# Patient Record
Sex: Male | Born: 1965
Health system: Southern US, Community
[De-identification: ages and names within clinical notes are randomized; demographics above are authoritative.]

## PROBLEM LIST (undated history)

## (undated) DIAGNOSIS — H35379 Puckering of macula, unspecified eye: Secondary | ICD-10-CM

## (undated) DIAGNOSIS — E785 Hyperlipidemia, unspecified: Secondary | ICD-10-CM

## (undated) HISTORY — DX: Puckering of macula, unspecified eye: H35.379

## (undated) HISTORY — PX: CIRCUMCISION: SUR203

## (undated) HISTORY — DX: Hyperlipidemia, unspecified: E78.5

---

## 2003-09-20 ENCOUNTER — Encounter: Payer: Self-pay | Admitting: Family Medicine

## 2006-02-04 ENCOUNTER — Ambulatory Visit: Payer: Self-pay | Admitting: Family Medicine

## 2006-02-04 LAB — CONVERTED CEMR LAB
Albumin: 4 g/dL (ref 3.5–5.2)
Basophils Absolute: 0 10*3/uL (ref 0.0–0.1)
Bilirubin, Direct: 0.1 mg/dL (ref 0.0–0.3)
Cholesterol: 188 mg/dL (ref 0–200)
Creatinine, Ser: 1 mg/dL (ref 0.4–1.5)
Eosinophils Absolute: 0.1 10*3/uL (ref 0.0–0.6)
HCT: 42.4 % (ref 39.0–52.0)
Hemoglobin: 14.9 g/dL (ref 13.0–17.0)
MCHC: 35.2 g/dL (ref 30.0–36.0)
MCV: 95.9 fL (ref 78.0–100.0)
Monocytes Absolute: 0.3 10*3/uL (ref 0.2–0.7)
Neutrophils Relative %: 67.5 % (ref 43.0–77.0)
PSA: 0.77 ng/mL (ref 0.10–4.00)
Potassium: 4.5 meq/L (ref 3.5–5.1)
Sodium: 142 meq/L (ref 135–145)
TSH: 1.11 microintl units/mL (ref 0.35–5.50)
Total Bilirubin: 0.9 mg/dL (ref 0.3–1.2)

## 2006-02-17 ENCOUNTER — Ambulatory Visit: Payer: Self-pay | Admitting: Family Medicine

## 2006-12-27 ENCOUNTER — Ambulatory Visit: Payer: Self-pay | Admitting: Internal Medicine

## 2007-08-04 ENCOUNTER — Ambulatory Visit: Payer: Self-pay | Admitting: Family Medicine

## 2007-08-04 LAB — CONVERTED CEMR LAB
ALT: 12 units/L (ref 0–53)
Albumin: 4.3 g/dL (ref 3.5–5.2)
BUN: 14 mg/dL (ref 6–23)
Basophils Relative: 0.5 % (ref 0.0–3.0)
CO2: 29 meq/L (ref 19–32)
Calcium: 9.6 mg/dL (ref 8.4–10.5)
Eosinophils Relative: 1.1 % (ref 0.0–5.0)
GFR calc Af Amer: 94 mL/min
Glucose, Bld: 86 mg/dL (ref 70–99)
HCT: 42.1 % (ref 39.0–52.0)
Hemoglobin: 14.8 g/dL (ref 13.0–17.0)
Ketones, urine, test strip: NEGATIVE
Lymphocytes Relative: 24.6 % (ref 12.0–46.0)
Monocytes Absolute: 0.3 10*3/uL (ref 0.1–1.0)
Monocytes Relative: 5.9 % (ref 3.0–12.0)
Neutro Abs: 3.2 10*3/uL (ref 1.4–7.7)
Nitrite: NEGATIVE
RBC: 4.38 M/uL (ref 4.22–5.81)
Specific Gravity, Urine: >=1.03
TSH: 1.2 microintl units/mL (ref 0.35–5.50)
Total CHOL/HDL Ratio: 3
Total Protein: 6.9 g/dL (ref 6.0–8.3)
Triglycerides: 32 mg/dL (ref 0–149)
WBC Urine, dipstick: NEGATIVE
WBC: 4.8 10*3/uL (ref 4.5–10.5)

## 2007-08-11 ENCOUNTER — Ambulatory Visit: Payer: Self-pay | Admitting: Family Medicine

## 2008-08-23 ENCOUNTER — Ambulatory Visit: Payer: Self-pay | Admitting: Family Medicine

## 2008-08-27 LAB — CONVERTED CEMR LAB
ALT: 12 units/L (ref 0–53)
AST: 21 units/L (ref 0–37)
Albumin: 4 g/dL (ref 3.5–5.2)
BUN: 14 mg/dL (ref 6–23)
Basophils Relative: 0 % (ref 0.0–3.0)
Chloride: 110 meq/L (ref 96–112)
Cholesterol: 187 mg/dL (ref 0–200)
Eosinophils Relative: 3.1 % (ref 0.0–5.0)
GFR calc non Af Amer: 93.09 mL/min (ref >60)
HCT: 44.8 % (ref 39.0–52.0)
Hemoglobin: 15.2 g/dL (ref 13.0–17.0)
LDL Cholesterol: 104 mg/dL — ABNORMAL HIGH (ref 0–99)
Lymphs Abs: 1.2 10*3/uL (ref 0.7–4.0)
MCV: 97.2 fL (ref 78.0–100.0)
Monocytes Absolute: 0.3 10*3/uL (ref 0.1–1.0)
Monocytes Relative: 7.2 % (ref 3.0–12.0)
Neutro Abs: 2.6 10*3/uL (ref 1.4–7.7)
PSA: 1.31 ng/mL (ref 0.10–4.00)
Platelets: 202 10*3/uL (ref 150.0–400.0)
Potassium: 4 meq/L (ref 3.5–5.1)
Sodium: 144 meq/L (ref 135–145)
TSH: 2.12 microintl units/mL (ref 0.35–5.50)
Total Bilirubin: 0.9 mg/dL (ref 0.3–1.2)
Total Protein: 6.7 g/dL (ref 6.0–8.3)
VLDL: 15.8 mg/dL (ref 0.0–40.0)
WBC: 4.2 10*3/uL — ABNORMAL LOW (ref 4.5–10.5)

## 2009-01-20 ENCOUNTER — Ambulatory Visit: Payer: Self-pay | Admitting: Family Medicine

## 2009-01-20 DIAGNOSIS — K644 Residual hemorrhoidal skin tags: Secondary | ICD-10-CM | POA: Insufficient documentation

## 2009-03-07 HISTORY — PX: VASECTOMY: SHX75

## 2009-06-10 ENCOUNTER — Ambulatory Visit: Payer: Self-pay | Admitting: Internal Medicine

## 2009-08-25 ENCOUNTER — Ambulatory Visit: Payer: Self-pay | Admitting: Family Medicine

## 2009-08-25 LAB — CONVERTED CEMR LAB
Ketones, urine, test strip: NEGATIVE
Nitrite: NEGATIVE
Specific Gravity, Urine: 1.02
Urobilinogen, UA: 0.2
WBC Urine, dipstick: NEGATIVE

## 2009-08-27 ENCOUNTER — Telehealth: Payer: Self-pay | Admitting: Family Medicine

## 2009-08-27 LAB — CONVERTED CEMR LAB
ALT: 20 units/L (ref 0–53)
Albumin: 4 g/dL (ref 3.5–5.2)
Alkaline Phosphatase: 35 units/L — ABNORMAL LOW (ref 39–117)
Basophils Relative: 0.5 % (ref 0.0–3.0)
Bilirubin, Direct: 0.2 mg/dL (ref 0.0–0.3)
CO2: 28 meq/L (ref 19–32)
Chloride: 105 meq/L (ref 96–112)
Creatinine, Ser: 1 mg/dL (ref 0.4–1.5)
Eosinophils Relative: 3.1 % (ref 0.0–5.0)
Hemoglobin: 15.1 g/dL (ref 13.0–17.0)
LDL Cholesterol: 109 mg/dL — ABNORMAL HIGH (ref 0–99)
Lymphocytes Relative: 25.5 % (ref 12.0–46.0)
MCV: 96 fL (ref 78.0–100.0)
Monocytes Absolute: 0.2 10*3/uL (ref 0.1–1.0)
Neutro Abs: 2.9 10*3/uL (ref 1.4–7.7)
Neutrophils Relative %: 66 % (ref 43.0–77.0)
RBC: 4.49 M/uL (ref 4.22–5.81)
Sodium: 140 meq/L (ref 135–145)
Total CHOL/HDL Ratio: 3
Total Protein: 6.4 g/dL (ref 6.0–8.3)
Triglycerides: 54 mg/dL (ref 0.0–149.0)
WBC: 4.4 10*3/uL — ABNORMAL LOW (ref 4.5–10.5)

## 2010-02-03 NOTE — Assessment & Plan Note (Signed)
Summary: BLISTER/SPIDER BITE OR POISON/PS   Vital Signs:  Patient profile:   45 year old male Height:      66.75 inches Weight:      154 pounds BMI:     24.39 Temp:     98.0 degrees F oral Pulse rate:   66 / minute BP sitting:   120 / 80  (right arm) Cuff size:   regular  Vitals Entered By: Romualdo Bolk, CMA (AAMA) (June 10, 2009 9:50 AM) CC: Blister on left arm- It has been on there since 6/4 and was itchy in the beginning. Pt was out working in the yard prior to blister coming up.   History of Present Illness: Neville Walston comes in today  for sda   onset 3 days ago with above.    began with ithing and redness and then blister like.  using cladryl . just wants to be check to see if serious.  No other slin areas and no fever  weakness . onset afer being outside.  Preventive Screening-Counseling & Management  Alcohol-Tobacco     Smoking Status: never  Caffeine-Diet-Exercise     Does Patient Exercise: yes  Current Medications (verified): 1)  None  Allergies (verified): No Known Drug Allergies  Past History:  Past medical, surgical, family and social histories (including risk factors) reviewed for relevance to current acute and chronic problems.  Past Medical History: Reviewed history from 08/11/2007 and no changes required. Unremarkable  Past Surgical History: Reviewed history from 08/23/2008 and no changes required. removal of sebaceous cyst from behind the right earlobe circumcision  Family History: Reviewed history from 08/23/2008 and no changes required. unremarkable  Social History: Reviewed history from 08/11/2007 and no changes required. Occupation: Married Never Smoked Alcohol use-no Drug use-no Regular exercise-yes  Review of Systems  The patient denies anorexia, fever, and abdominal pain.    Physical Exam  General:  Well-developed,well-nourished,in no acute distress; alert,appropriate and cooperative throughout examination Skin:   left forearm with    2 mm papule looks blistery but is firmer than that   sppoth otherwise and no redness or boil or pustule    rest of arm is normal    Impression & Recommendations:  Problem # 1:  OTHER SPECIFIED DISORDER OF SKIN (ICD-709.8) ? if bite that is resolving  doesnt not look like bacterial infefcion and no alarm features .    plan is  obervation and receck if needed . No systemic signs  today    Patient Instructions: 1)   local care can use hydrocortisone  2 x per day  2)  expect significant improvment resolution in a week. 3)  Call if fever  redness and   swelling and pain  ( like a boil)  4)  If continuing     and  or progressing  can   get  FU appt with Dr Clent Ridges to check.

## 2010-02-03 NOTE — Progress Notes (Signed)
Summary: rtc  Phone Note Call from Patient Call back at Home Phone [phone]   Caller: Patient Call For: Nelwyn Salisbury MD Summary of Call: pt is return judy call Initial call taken by: Heron Sabins,  August 27, 2009 4:45 PM  Follow-up for Phone Call        called report Follow-up by: Raechel Ache, RN,  August 27, 2009 4:51 PM

## 2010-02-03 NOTE — Assessment & Plan Note (Signed)
Summary: cpx-will fast//ccm   Vital Signs:  Patient profile:   45 year old male Weight:      153 pounds BMI:     24.23 BP sitting:   114 / 70  (left arm) Cuff size:   regular  Vitals Entered By: Raechel Ache, RN (August 25, 2009 2:25 PM) CC: CPX, labs done.   History of Present Illness: 45 yr old male for a cpx. He feels fine and has no complaints.   Allergies (verified): No Known Drug Allergies  Past History:  Past Medical History: Reviewed history from 08/11/2007 and no changes required. Unremarkable  Past Surgical History: removal of sebaceous cyst from behind the right earlobe circumcision Vasectomy 03-07-09 per Dr. Su Grand  Family History: Reviewed history from 08/23/2008 and no changes required. unremarkable  Social History: Reviewed history from 08/11/2007 and no changes required. Occupation: Married Never Smoked Alcohol use-no Drug use-no Regular exercise-yes  Review of Systems  The patient denies anorexia, fever, weight loss, weight gain, vision loss, decreased hearing, hoarseness, chest pain, syncope, dyspnea on exertion, peripheral edema, prolonged cough, headaches, hemoptysis, abdominal pain, melena, hematochezia, severe indigestion/heartburn, hematuria, incontinence, genital sores, muscle weakness, suspicious skin lesions, transient blindness, difficulty walking, depression, unusual weight change, abnormal bleeding, enlarged lymph nodes, angioedema, breast masses, and testicular masses.    Physical Exam  General:  Well-developed,well-nourished,in no acute distress; alert,appropriate and cooperative throughout examination Head:  Normocephalic and atraumatic without obvious abnormalities. No apparent alopecia or balding. Eyes:  No corneal or conjunctival inflammation noted. EOMI. Perrla. Funduscopic exam benign, without hemorrhages, exudates or papilledema. Vision grossly normal. Ears:  External ear exam shows no significant lesions or deformities.   Otoscopic examination reveals clear canals, tympanic membranes are intact bilaterally without bulging, retraction, inflammation or discharge. Hearing is grossly normal bilaterally. Nose:  External nasal examination shows no deformity or inflammation. Nasal mucosa are pink and moist without lesions or exudates. Mouth:  Oral mucosa and oropharynx without lesions or exudates.  Teeth in good repair. Neck:  No deformities, masses, or tenderness noted. Chest Wall:  No deformities, masses, tenderness or gynecomastia noted. Lungs:  Normal respiratory effort, chest expands symmetrically. Lungs are clear to auscultation, no crackles or wheezes. Heart:  Normal rate and regular rhythm. S1 and S2 normal without gallop, murmur, click, rub or other extra sounds. Abdomen:  Bowel sounds positive,abdomen soft and non-tender without masses, organomegaly or hernias noted. Rectal:  No external abnormalities noted. Normal sphincter tone. No rectal masses or tenderness. Genitalia:  Testes bilaterally descended without nodularity, tenderness or masses. No scrotal masses or lesions. No penis lesions or urethral discharge. Prostate:  Prostate gland firm and smooth, no enlargement, nodularity, tenderness, mass, asymmetry or induration. Msk:  No deformity or scoliosis noted of thoracic or lumbar spine.   Pulses:  R and L carotid,radial,femoral,dorsalis pedis and posterior tibial pulses are full and equal bilaterally Extremities:  No clubbing, cyanosis, edema, or deformity noted with normal full range of motion of all joints.   Neurologic:  No cranial nerve deficits noted. Station and gait are normal. Plantar reflexes are down-going bilaterally. DTRs are symmetrical throughout. Sensory, motor and coordinative functions appear intact. Skin:  Intact without suspicious lesions or rashes Cervical Nodes:  No lymphadenopathy noted Axillary Nodes:  No palpable lymphadenopathy Inguinal Nodes:  No significant adenopathy Psych:   Cognition and judgment appear intact. Alert and cooperative with normal attention span and concentration. No apparent delusions, illusions, hallucinations   Impression & Recommendations:  Problem # 1:  HEALTH  MAINTENANCE EXAM (ICD-V70.0)  Other Orders: Venipuncture (16109) Specimen Handling (60454) TLB-Lipid Panel (80061-LIPID) TLB-BMP (Basic Metabolic Panel-BMET) (80048-METABOL) TLB-CBC Platelet - w/Differential (85025-CBCD) TLB-Hepatic/Liver Function Pnl (80076-HEPATIC) TLB-TSH (Thyroid Stimulating Hormone) (84443-TSH) TLB-PSA (Prostate Specific Antigen) (84153-PSA) UA Dipstick w/o Micro (automated)  (81003)  Patient Instructions: 1)  Please schedule a follow-up appointment in 1 year.   Laboratory Results   Urine Tests    Routine Urinalysis   Color: yellow Appearance: Clear Glucose: negative   (Normal Range: Negative) Bilirubin: negative   (Normal Range: Negative) Ketone: negative   (Normal Range: Negative) Spec. Gravity: 1.020   (Normal Range: 1.003-1.035) Blood: negative   (Normal Range: Negative) pH: 7.0   (Normal Range: 5.0-8.0) Protein: negative   (Normal Range: Negative) Urobilinogen: 0.2   (Normal Range: 0-1) Nitrite: negative   (Normal Range: Negative) Leukocyte Esterace: negative   (Normal Range: Negative)    Comments: Rita Ohara  August 25, 2009 12:04 PM

## 2010-02-03 NOTE — Assessment & Plan Note (Signed)
Summary: HEMATOCHEZIA, SOME ABD DISCOMFORT // RS   Vital Signs:  Patient profile:   45 year old male Weight:      149 pounds Temp:     97.7 degrees F oral Pulse rate:   74 / minute BP sitting:   114 / 74  (left arm) Cuff size:   regular  Vitals Entered By: Alfred Levins, CMA (January 20, 2009 2:01 PM) CC: blood in stool   History of Present Illness: Here for one episode of bright red blood in the stool over this past weekend. This happened with a BM that was not strained and not uncomfortable. He has had several BMs since then with no more bleeding. There is no pain.   Allergies: No Known Drug Allergies  Past History:  Past Medical History: Reviewed history from 08/11/2007 and no changes required. Unremarkable  Past Surgical History: Reviewed history from 08/23/2008 and no changes required. removal of sebaceous cyst from behind the right earlobe circumcision  Review of Systems  The patient denies anorexia, fever, weight loss, weight gain, vision loss, decreased hearing, hoarseness, chest pain, syncope, dyspnea on exertion, peripheral edema, prolonged cough, headaches, hemoptysis, abdominal pain, melena, severe indigestion/heartburn, hematuria, incontinence, genital sores, muscle weakness, suspicious skin lesions, transient blindness, difficulty walking, depression, unusual weight change, abnormal bleeding, enlarged lymph nodes, angioedema, breast masses, and testicular masses.    Physical Exam  General:  Well-developed,well-nourished,in no acute distress; alert,appropriate and cooperative throughout examination Abdomen:  Bowel sounds positive,abdomen soft and non-tender without masses, organomegaly or hernias noted. Rectal:  several small external hemorrhoids, one of which has some dried  blood on it   Impression & Recommendations:  Problem # 1:  EXTERNAL HEMORRHOIDS (ICD-455.3)  Patient Instructions: 1)  Use Preparation H as needed . increase fiber in the diet

## 2010-08-14 ENCOUNTER — Other Ambulatory Visit (INDEPENDENT_AMBULATORY_CARE_PROVIDER_SITE_OTHER): Payer: Self-pay

## 2010-08-14 DIAGNOSIS — Z0000 Encounter for general adult medical examination without abnormal findings: Secondary | ICD-10-CM

## 2010-08-14 LAB — CBC WITH DIFFERENTIAL/PLATELET
Basophils Absolute: 0 10*3/uL (ref 0.0–0.1)
Eosinophils Absolute: 0.1 10*3/uL (ref 0.0–0.7)
Lymphocytes Relative: 26.9 % (ref 12.0–46.0)
MCHC: 34.1 g/dL (ref 30.0–36.0)
Neutro Abs: 3.4 10*3/uL (ref 1.4–7.7)
Neutrophils Relative %: 65.8 % (ref 43.0–77.0)
Platelets: 208 10*3/uL (ref 150.0–400.0)
RDW: 13.5 % (ref 11.5–14.6)

## 2010-08-14 LAB — POCT URINALYSIS DIPSTICK
Bilirubin, UA: NEGATIVE
Glucose, UA: NEGATIVE
Ketones, UA: NEGATIVE
Leukocytes, UA: NEGATIVE
Nitrite, UA: NEGATIVE
pH, UA: 7

## 2010-08-14 LAB — BASIC METABOLIC PANEL
BUN: 13 mg/dL (ref 6–23)
CO2: 29 mEq/L (ref 19–32)
Calcium: 9.1 mg/dL (ref 8.4–10.5)
Creatinine, Ser: 0.9 mg/dL (ref 0.4–1.5)
Glucose, Bld: 97 mg/dL (ref 70–99)

## 2010-08-14 LAB — LIPID PANEL: Total CHOL/HDL Ratio: 3

## 2010-08-14 LAB — HEPATIC FUNCTION PANEL
Albumin: 3.9 g/dL (ref 3.5–5.2)
Alkaline Phosphatase: 38 U/L — ABNORMAL LOW (ref 39–117)
Bilirubin, Direct: 0.2 mg/dL (ref 0.0–0.3)
Total Bilirubin: 1.3 mg/dL — ABNORMAL HIGH (ref 0.3–1.2)

## 2010-08-14 LAB — TSH: TSH: 0.78 u[IU]/mL (ref 0.35–5.50)

## 2010-08-14 LAB — PSA: PSA: 1.22 ng/mL (ref 0.10–4.00)

## 2010-08-19 NOTE — Progress Notes (Signed)
Left voice message with results.

## 2010-08-21 ENCOUNTER — Other Ambulatory Visit: Payer: Self-pay

## 2010-08-28 ENCOUNTER — Ambulatory Visit (INDEPENDENT_AMBULATORY_CARE_PROVIDER_SITE_OTHER): Payer: BC Managed Care – PPO | Admitting: Family Medicine

## 2010-08-28 ENCOUNTER — Encounter: Payer: Self-pay | Admitting: Family Medicine

## 2010-08-28 VITALS — BP 124/80 | HR 64 | Temp 98.6°F | Ht 66.75 in | Wt 164.0 lb

## 2010-08-28 DIAGNOSIS — Z0000 Encounter for general adult medical examination without abnormal findings: Secondary | ICD-10-CM

## 2010-08-28 NOTE — Progress Notes (Signed)
  Subjective:    Patient ID: Eric Weeks, male    DOB: 01/12/1965, 45 y.o.   MRN: [id number]  HPI 45 yr old male for a cpx. He feels fine and has no concerns.    Review of Systems  Constitutional: Negative.   HENT: Negative.   Eyes: Negative.   Respiratory: Negative.   Cardiovascular: Negative.   Gastrointestinal: Negative.   Genitourinary: Negative.   Musculoskeletal: Negative.   Skin: Negative.   Neurological: Negative.   Hematological: Negative.   Psychiatric/Behavioral: Negative.        Objective:   Physical Exam  Constitutional: He is oriented to person, place, and time. He appears well-developed and well-nourished. No distress.  HENT:  Head: Normocephalic and atraumatic.  Right Ear: External ear normal.  Left Ear: External ear normal.  Nose: Nose normal.  Mouth/Throat: Oropharynx is clear and moist. No oropharyngeal exudate.  Eyes: Conjunctivae and EOM are normal. Pupils are equal, round, and reactive to light. Right eye exhibits no discharge. Left eye exhibits no discharge. No scleral icterus.  Neck: Neck supple. No JVD present. No tracheal deviation present. No thyromegaly present.  Cardiovascular: Normal rate, regular rhythm, normal heart sounds and intact distal pulses.  Exam reveals no gallop and no friction rub.   No murmur heard. Pulmonary/Chest: Effort normal and breath sounds normal. No respiratory distress. He has no wheezes. He has no rales. He exhibits no tenderness.  Abdominal: Soft. Bowel sounds are normal. He exhibits no distension and no mass. There is no tenderness. There is no rebound and no guarding.  Genitourinary: Rectum normal, prostate normal and penis normal. Guaiac negative stool. No penile tenderness.  Musculoskeletal: Normal range of motion. He exhibits no edema and no tenderness.  Lymphadenopathy:    He has no cervical adenopathy.  Neurological: He is alert and oriented to person, place, and time. He has normal reflexes. No cranial nerve  deficit. He exhibits normal muscle tone. Coordination normal.  Skin: Skin is warm and dry. No rash noted. He is not diaphoretic. No erythema. No pallor.  Psychiatric: He has a normal mood and affect. His behavior is normal. Judgment and thought content normal.          Assessment & Plan:  Well exam

## 2010-12-01 ENCOUNTER — Ambulatory Visit: Payer: BC Managed Care – PPO | Admitting: Family Medicine

## 2011-08-27 ENCOUNTER — Encounter: Payer: BC Managed Care – PPO | Admitting: Family Medicine

## 2011-08-27 ENCOUNTER — Other Ambulatory Visit (INDEPENDENT_AMBULATORY_CARE_PROVIDER_SITE_OTHER): Payer: 59

## 2011-08-27 DIAGNOSIS — Z0000 Encounter for general adult medical examination without abnormal findings: Secondary | ICD-10-CM

## 2011-08-27 LAB — BASIC METABOLIC PANEL
Calcium: 9.3 mg/dL (ref 8.4–10.5)
GFR: 100.24 mL/min (ref >60.00)
Glucose, Bld: 80 mg/dL (ref 70–99)
Sodium: 141 mEq/L (ref 135–145)

## 2011-08-27 LAB — CBC WITH DIFFERENTIAL/PLATELET
Basophils Absolute: 0 10*3/uL (ref 0.0–0.1)
Eosinophils Relative: 1.7 % (ref 0.0–5.0)
Hemoglobin: 14.9 g/dL (ref 13.0–17.0)
Lymphocytes Relative: 28.2 % (ref 12.0–46.0)
Monocytes Relative: 6.1 % (ref 3.0–12.0)
Neutro Abs: 3.7 10*3/uL (ref 1.4–7.7)
Platelets: 201 10*3/uL (ref 150.0–400.0)
RDW: 13 % (ref 11.5–14.6)
WBC: 5.8 10*3/uL (ref 4.5–10.5)

## 2011-08-27 LAB — HEPATIC FUNCTION PANEL
AST: 25 U/L (ref 0–37)
Alkaline Phosphatase: 38 U/L — ABNORMAL LOW (ref 39–117)
Total Bilirubin: 1.1 mg/dL (ref 0.3–1.2)

## 2011-08-27 LAB — LIPID PANEL
Cholesterol: 192 mg/dL (ref 0–200)
HDL: 65.4 mg/dL (ref >39.00)
LDL Cholesterol: 116 mg/dL — ABNORMAL HIGH (ref 0–99)
Total CHOL/HDL Ratio: 3
Triglycerides: 52 mg/dL (ref 0.0–149.0)
VLDL: 10.4 mg/dL (ref 0.0–40.0)

## 2011-08-27 LAB — POCT URINALYSIS DIPSTICK
Glucose, UA: NEGATIVE
Ketones, UA: NEGATIVE
Spec Grav, UA: >=1.03

## 2011-08-27 LAB — TSH: TSH: 1.38 u[IU]/mL (ref 0.35–5.50)

## 2011-09-01 ENCOUNTER — Encounter: Payer: Self-pay | Admitting: Family Medicine

## 2011-09-01 NOTE — Progress Notes (Signed)
Quick Note:  I left message and put a copy of results in mail. ______

## 2011-09-03 ENCOUNTER — Encounter: Payer: BC Managed Care – PPO | Admitting: Family Medicine

## 2011-10-04 ENCOUNTER — Encounter: Payer: Self-pay | Admitting: Family Medicine

## 2011-10-04 ENCOUNTER — Ambulatory Visit (INDEPENDENT_AMBULATORY_CARE_PROVIDER_SITE_OTHER): Payer: 59 | Admitting: Family Medicine

## 2011-10-04 VITALS — BP 120/80 | HR 63 | Temp 98.2°F | Ht 66.0 in | Wt 149.0 lb

## 2011-10-04 DIAGNOSIS — Z0000 Encounter for general adult medical examination without abnormal findings: Secondary | ICD-10-CM

## 2011-10-04 NOTE — Progress Notes (Signed)
  Subjective:    Patient ID: [name], male    DOB: 02-11-1965, 46 y.o.   MRN: [id number]  HPI 46 yr old male for a cpx. He has 2 concerns to discuss. First he has had frequent twitching of the eyelids on the left eye for the past month. He denies any stressors in his life lately but he has not gotten as much sleep as he used to, averaging 5-6 hours a night. Second he has had 2 weeks of PND, scratchy throat, and a slight dry cough. No fever or HA.    Review of Systems  Constitutional: Negative.   HENT: Negative.   Eyes: Negative.   Respiratory: Negative.   Cardiovascular: Negative.   Gastrointestinal: Negative.   Genitourinary: Negative.   Musculoskeletal: Negative.   Skin: Negative.   Neurological: Negative.   Hematological: Negative.   Psychiatric/Behavioral: Negative.        Objective:   Physical Exam  Constitutional: He is oriented to person, place, and time. He appears well-developed and well-nourished. No distress.  HENT:  Head: Normocephalic and atraumatic.  Right Ear: External ear normal.  Left Ear: External ear normal.  Nose: Nose normal.  Mouth/Throat: Oropharynx is clear and moist. No oropharyngeal exudate.  Eyes: Conjunctivae normal and EOM are normal. Pupils are equal, round, and reactive to light. Right eye exhibits no discharge. Left eye exhibits no discharge. No scleral icterus.  Neck: Neck supple. No JVD present. No tracheal deviation present. No thyromegaly present.  Cardiovascular: Normal rate, regular rhythm, normal heart sounds and intact distal pulses.  Exam reveals no gallop and no friction rub.   No murmur heard. Pulmonary/Chest: Effort normal and breath sounds normal. No respiratory distress. He has no wheezes. He has no rales. He exhibits no tenderness.  Abdominal: Soft. Bowel sounds are normal. He exhibits no distension and no mass. There is no tenderness. There is no rebound and no guarding.  Genitourinary: Rectum normal, prostate normal and  penis normal. Guaiac negative stool. No penile tenderness.  Musculoskeletal: Normal range of motion. He exhibits no edema and no tenderness.  Lymphadenopathy:    He has no cervical adenopathy.  Neurological: He is alert and oriented to person, place, and time. He has normal reflexes. No cranial nerve deficit. He exhibits normal muscle tone. Coordination normal.  Skin: Skin is warm and dry. No rash noted. He is not diaphoretic. No erythema. No pallor.  Psychiatric: He has a normal mood and affect. His behavior is normal. Judgment and thought content normal.          Assessment & Plan:  Well exam. The eyelid twitching is probably from not getting enough sleep, so he will try to improve this. His other symptoms are probably related to allergies, since it is ragweed season outside. Try Claritin daily.

## 2012-01-17 ENCOUNTER — Ambulatory Visit: Payer: 59 | Admitting: Family Medicine

## 2012-02-07 ENCOUNTER — Ambulatory Visit (INDEPENDENT_AMBULATORY_CARE_PROVIDER_SITE_OTHER): Payer: Federal, State, Local not specified - PPO | Admitting: Family Medicine

## 2012-02-07 ENCOUNTER — Encounter: Payer: Self-pay | Admitting: Family Medicine

## 2012-02-07 VITALS — BP 122/78 | HR 74 | Temp 98.3°F | Wt 164.0 lb

## 2012-02-07 DIAGNOSIS — J40 Bronchitis, not specified as acute or chronic: Secondary | ICD-10-CM

## 2012-02-07 MED ORDER — HYDROCODONE-HOMATROPINE 5-1.5 MG/5ML PO SYRP: 5.0000 mL | ORAL_SOLUTION | ORAL | Status: DC | PRN

## 2012-02-07 MED ORDER — AZITHROMYCIN 250 MG PO TABS: ORAL_TABLET | ORAL | Status: DC

## 2012-02-07 NOTE — Progress Notes (Signed)
  Subjective:    Patient ID: Eric Weeks, male    DOB: 07-20-1965, 47 y.o.   MRN: [id number]  HPI Here for one week of chest tightness and a dry cough. No fever.    Review of Systems  Constitutional: Negative.   HENT: Negative.   Eyes: Negative.   Respiratory: Positive for cough, chest tightness and wheezing. Negative for shortness of breath.   Cardiovascular: Negative.        Objective:   Physical Exam  Constitutional: He appears well-developed and well-nourished.  HENT:  Right Ear: External ear normal.  Left Ear: External ear normal.  Nose: Nose normal.  Mouth/Throat: Oropharynx is clear and moist.  Eyes: Conjunctivae normal are normal.  Pulmonary/Chest: Effort normal. No respiratory distress. He has no wheezes. He has no rales.       Scattered rhonchi   Lymphadenopathy:    He has no cervical adenopathy.          Assessment & Plan:  Add Mucinex prn

## 2012-09-29 ENCOUNTER — Other Ambulatory Visit (INDEPENDENT_AMBULATORY_CARE_PROVIDER_SITE_OTHER): Payer: Federal, State, Local not specified - PPO

## 2012-09-29 DIAGNOSIS — Z0000 Encounter for general adult medical examination without abnormal findings: Secondary | ICD-10-CM

## 2012-09-29 LAB — POCT URINALYSIS DIPSTICK
Blood, UA: NEGATIVE
Glucose, UA: NEGATIVE
Nitrite, UA: NEGATIVE
Urobilinogen, UA: 0.2
pH, UA: 5.5

## 2012-09-29 LAB — CBC WITH DIFFERENTIAL/PLATELET
Basophils Absolute: 0 10*3/uL (ref 0.0–0.1)
Basophils Relative: 0.2 % (ref 0.0–3.0)
Hemoglobin: 15 g/dL (ref 13.0–17.0)
Lymphocytes Relative: 25.3 % (ref 12.0–46.0)
Monocytes Relative: 6 % (ref 3.0–12.0)
Neutro Abs: 3.8 10*3/uL (ref 1.4–7.7)
RBC: 4.49 Mil/uL (ref 4.22–5.81)

## 2012-09-29 LAB — BASIC METABOLIC PANEL
BUN: 16 mg/dL (ref 6–23)
CO2: 25 mEq/L (ref 19–32)
Chloride: 105 mEq/L (ref 96–112)
Creatinine, Ser: 1 mg/dL (ref 0.4–1.5)
Potassium: 3.6 mEq/L (ref 3.5–5.1)

## 2012-09-29 LAB — TSH: TSH: 0.84 u[IU]/mL (ref 0.35–5.50)

## 2012-09-29 LAB — LIPID PANEL
LDL Cholesterol: 109 mg/dL — ABNORMAL HIGH (ref 0–99)
Total CHOL/HDL Ratio: 3
Triglycerides: 45 mg/dL (ref 0.0–149.0)
VLDL: 9 mg/dL (ref 0.0–40.0)

## 2012-09-29 LAB — HEPATIC FUNCTION PANEL
ALT: 15 U/L (ref 0–53)
AST: 23 U/L (ref 0–37)
Albumin: 4.2 g/dL (ref 3.5–5.2)
Alkaline Phosphatase: 36 U/L — ABNORMAL LOW (ref 39–117)
Total Protein: 6.7 g/dL (ref 6.0–8.3)

## 2012-10-03 NOTE — Progress Notes (Signed)
Quick Note:  Pt has appointment on 10/06/12 will go over then. ______

## 2012-10-06 ENCOUNTER — Ambulatory Visit (INDEPENDENT_AMBULATORY_CARE_PROVIDER_SITE_OTHER): Payer: Federal, State, Local not specified - PPO | Admitting: Family Medicine

## 2012-10-06 ENCOUNTER — Encounter: Payer: Self-pay | Admitting: Family Medicine

## 2012-10-06 VITALS — BP 120/80 | HR 63 | Temp 98.4°F | Ht 65.75 in | Wt 150.0 lb

## 2012-10-06 DIAGNOSIS — Z0000 Encounter for general adult medical examination without abnormal findings: Secondary | ICD-10-CM

## 2012-10-06 NOTE — Progress Notes (Signed)
  Subjective:    Patient ID: Eric Weeks, male    DOB: 07/07/1965, 47 y.o.   MRN: [id number]  HPI 47 yr old male for a cpx. He feels fine.    Review of Systems  Constitutional: Negative.   HENT: Negative.   Eyes: Negative.   Respiratory: Negative.   Cardiovascular: Negative.   Gastrointestinal: Negative.   Genitourinary: Negative.   Musculoskeletal: Negative.   Skin: Negative.   Neurological: Negative.   Psychiatric/Behavioral: Negative.        Objective:   Physical Exam  Constitutional: He is oriented to person, place, and time. He appears well-developed and well-nourished. No distress.  HENT:  Head: Normocephalic and atraumatic.  Right Ear: External ear normal.  Left Ear: External ear normal.  Nose: Nose normal.  Mouth/Throat: Oropharynx is clear and moist. No oropharyngeal exudate.  Eyes: Conjunctivae and EOM are normal. Pupils are equal, round, and reactive to light. Right eye exhibits no discharge. Left eye exhibits no discharge. No scleral icterus.  Neck: Neck supple. No JVD present. No tracheal deviation present. No thyromegaly present.  Cardiovascular: Normal rate, regular rhythm, normal heart sounds and intact distal pulses.  Exam reveals no gallop and no friction rub.   No murmur heard. Pulmonary/Chest: Effort normal and breath sounds normal. No respiratory distress. He has no wheezes. He has no rales. He exhibits no tenderness.  Abdominal: Soft. Bowel sounds are normal. He exhibits no distension and no mass. There is no tenderness. There is no rebound and no guarding.  Genitourinary: Rectum normal, prostate normal and penis normal. Guaiac negative stool. No penile tenderness.  Musculoskeletal: Normal range of motion. He exhibits no edema and no tenderness.  Lymphadenopathy:    He has no cervical adenopathy.  Neurological: He is alert and oriented to person, place, and time. He has normal reflexes. No cranial nerve deficit. He exhibits normal muscle tone.  Coordination normal.  Skin: Skin is warm and dry. No rash noted. He is not diaphoretic. No erythema. No pallor.  Psychiatric: He has a normal mood and affect. His behavior is normal. Judgment and thought content normal.          Assessment & Plan:  Well exam

## 2013-03-14 ENCOUNTER — Ambulatory Visit (INDEPENDENT_AMBULATORY_CARE_PROVIDER_SITE_OTHER): Payer: PRIVATE HEALTH INSURANCE | Admitting: Family Medicine

## 2013-03-14 ENCOUNTER — Encounter: Payer: Self-pay | Admitting: Family Medicine

## 2013-03-14 VITALS — BP 128/72 | HR 77 | Wt 150.0 lb

## 2013-03-14 DIAGNOSIS — B029 Zoster without complications: Secondary | ICD-10-CM

## 2013-03-14 MED ORDER — VALACYCLOVIR HCL 1 G PO TABS: 1000.0000 mg | ORAL_TABLET | Freq: Three times a day (TID) | ORAL | Status: DC

## 2013-03-14 NOTE — Patient Instructions (Signed)
Shingles Shingles (herpes zoster) is an infection that is caused by the same virus that causes chickenpox (varicella). The infection causes a painful skin rash and fluid-filled blisters, which eventually break open, crust over, and heal. It may occur in any area of the body, but it usually affects only one side of the body or face. The pain of shingles usually lasts about 1 month. However, some people with shingles may develop long-term (chronic) pain in the affected area of the body. Shingles often occurs many years after the person had chickenpox. It is more common:  In people older than 50 years.  In people with weakened immune systems, such as those with HIV, AIDS, or cancer.  In people taking medicines that weaken the immune system, such as transplant medicines.  In people under great stress. CAUSES  Shingles is caused by the varicella zoster virus (VZV), which also causes chickenpox. After a person is infected with the virus, it can remain in the person's body for years in an inactive state (dormant). To cause shingles, the virus reactivates and breaks out as an infection in a nerve root. The virus can be spread from person to person (contagious) through contact with open blisters of the shingles rash. It will only spread to people who have not had chickenpox. When these people are exposed to the virus, they may develop chickenpox. They will not develop shingles. Once the blisters scab over, the person is no longer contagious and cannot spread the virus to others. SYMPTOMS  Shingles shows up in stages. The initial symptoms may be pain, itching, and tingling in an area of the skin. This pain is usually described as burning, stabbing, or throbbing.In a few days or weeks, a painful red rash will appear in the area where the pain, itching, and tingling were felt. The rash is usually on one side of the body in a band or belt-like pattern. Then, the rash usually turns into fluid-filled blisters. They  will scab over and dry up in approximately 2 3 weeks. Flu-like symptoms may also occur with the initial symptoms, the rash, or the blisters. These may include:  Fever.  Chills.  Headache.  Upset stomach. DIAGNOSIS  Your caregiver will perform a skin exam to diagnose shingles. Skin scrapings or fluid samples may also be taken from the blisters. This sample will be examined under a microscope or sent to a lab for further testing. TREATMENT  There is no specific cure for shingles. Your caregiver will likely prescribe medicines to help you manage the pain, recover faster, and avoid long-term problems. This may include antiviral drugs, anti-inflammatory drugs, and pain medicines. HOME CARE INSTRUCTIONS   Take a cool bath or apply cool compresses to the area of the rash or blisters as directed. This may help with the pain and itching.   Only take over-the-counter or prescription medicines as directed by your caregiver.   Rest as directed by your caregiver.  Keep your rash and blisters clean with mild soap and cool water or as directed by your caregiver.  Do not pick your blisters or scratch your rash. Apply an anti-itch cream or numbing creams to the affected area as directed by your caregiver.  Keep your shingles rash covered with a loose bandage (dressing).  Avoid skin contact with:  Babies.   Pregnant women.   Children with eczema.   Elderly people with transplants.   People with chronic illnesses, such as leukemia or AIDS.   Wear loose-fitting clothing to help ease   the pain of material rubbing against the rash.  Keep all follow-up appointments with your caregiver.If the area involved is on your face, you may receive a referral for follow-up to a specialist, such as an eye doctor (ophthalmologist) or an ear, nose, and throat (ENT) doctor. Keeping all follow-up appointments will help you avoid eye complications, chronic pain, or disability.  SEEK IMMEDIATE MEDICAL  CARE IF:   You have facial pain, pain around the eye area, or loss of feeling on one side of your face.  You have ear pain or ringing in your ear.  You have loss of taste.  Your pain is not relieved with prescribed medicines.   Your redness or swelling spreads.   You have more pain and swelling.  Your condition is worsening or has changed.   You have a feveror persistent symptoms for more than 2 3 days.  You have a fever and your symptoms suddenly get worse. MAKE SURE YOU:  Understand these instructions.  Will watch your condition.  Will get help right away if you are not doing well or get worse. Document Released: 12/21/2004 Document Revised: 09/15/2011 Document Reviewed: 08/05/2011 [employer] Patient Information 2014 [employer].  

## 2013-03-14 NOTE — Progress Notes (Signed)
   Subjective:    Patient ID: Eric Weeks, male    DOB: Oct 07, 1965, 48 y.o.   MRN: [id number]  HPI Acute visit for vesicular rash right side. This started few days ago. Mostly pruritic and nonpainful. No fevers or chills. He does recall having chickenpox as a child. No exacerbating factors. He is complaining of some itching but has not taken anything for this. No generalized rash. He has scattered patches following dermatome distribution right lower thoracic region.  Past Medical History  Diagnosis Date  . Epiretinal membrane     sees [facility] [address]    Past Surgical History  Procedure Laterality Date  . Vasectomy  03-07-09    per Dr. Su GrandMarc Nesi  . Circumcision      reports that he has never smoked. He has never used smokeless tobacco. He reports that he does not drink alcohol or use illicit drugs. family history is not on file. No Known Allergies    Review of Systems  Constitutional: Negative for fever and chills.  Skin: Positive for rash.       Objective:   Physical Exam  Constitutional: He appears well-developed and well-nourished.  Cardiovascular: Normal rate.   Pulmonary/Chest: Effort normal and breath sounds normal. No respiratory distress. He has no wheezes. He has no rales.  Skin: Rash noted.  Patient has vesicular rash with erythematous base and patch-like distribution with skipped areas involving right lower thoracic dermatome and spreading anterior toward the right lower abdomen          Assessment & Plan:  Shingles. Minimal pain. Mostly pruritic. Valtrex 1 g 3 times a day for 7 days. He'll try Benadryl for itching at night

## 2013-03-14 NOTE — Progress Notes (Signed)
Pre visit review using our clinic review tool, if applicable. No additional management support is needed unless otherwise documented below in the visit note. 

## 2013-10-05 ENCOUNTER — Other Ambulatory Visit: Payer: Federal, State, Local not specified - PPO

## 2013-10-08 ENCOUNTER — Other Ambulatory Visit (INDEPENDENT_AMBULATORY_CARE_PROVIDER_SITE_OTHER): Payer: PRIVATE HEALTH INSURANCE

## 2013-10-08 DIAGNOSIS — Z0000 Encounter for general adult medical examination without abnormal findings: Secondary | ICD-10-CM

## 2013-10-08 LAB — CBC WITH DIFFERENTIAL/PLATELET
Basophils Absolute: 0 10*3/uL (ref 0.0–0.1)
Basophils Relative: 0.4 % (ref 0.0–3.0)
EOS ABS: 0.1 10*3/uL (ref 0.0–0.7)
Eosinophils Relative: 0.9 % (ref 0.0–5.0)
HCT: 45.2 % (ref 39.0–52.0)
Hemoglobin: 15.4 g/dL (ref 13.0–17.0)
LYMPHS PCT: 15.4 % (ref 12.0–46.0)
Lymphs Abs: 1 10*3/uL (ref 0.7–4.0)
MCHC: 34 g/dL (ref 30.0–36.0)
MCV: 97.1 fl (ref 78.0–100.0)
Monocytes Absolute: 0.3 10*3/uL (ref 0.1–1.0)
Monocytes Relative: 4 % (ref 3.0–12.0)
NEUTROS ABS: 5.1 10*3/uL (ref 1.4–7.7)
NEUTROS PCT: 79.3 % — AB (ref 43.0–77.0)
Platelets: 233 10*3/uL (ref 150.0–400.0)
RBC: 4.65 Mil/uL (ref 4.22–5.81)
RDW: 12.8 % (ref 11.5–15.5)
WBC: 6.5 10*3/uL (ref 4.0–10.5)

## 2013-10-08 LAB — LIPID PANEL
CHOLESTEROL: 200 mg/dL (ref 0–200)
HDL: 67.2 mg/dL (ref >39.00)
LDL Cholesterol: 123 mg/dL — ABNORMAL HIGH (ref 0–99)
NonHDL: 132.8
Total CHOL/HDL Ratio: 3
Triglycerides: 50 mg/dL (ref 0.0–149.0)
VLDL: 10 mg/dL (ref 0.0–40.0)

## 2013-10-08 LAB — PSA: PSA: 1.43 ng/mL (ref 0.10–4.00)

## 2013-10-08 LAB — POCT URINALYSIS DIPSTICK
Bilirubin, UA: NEGATIVE
Blood, UA: NEGATIVE
Glucose, UA: NEGATIVE
KETONES UA: NEGATIVE
LEUKOCYTES UA: NEGATIVE
NITRITE UA: NEGATIVE
PROTEIN UA: NEGATIVE
Spec Grav, UA: 1.015
Urobilinogen, UA: 0.2
pH, UA: 7

## 2013-10-08 LAB — HEPATIC FUNCTION PANEL
ALK PHOS: 42 U/L (ref 39–117)
ALT: 16 U/L (ref 0–53)
AST: 22 U/L (ref 0–37)
Albumin: 4.1 g/dL (ref 3.5–5.2)
BILIRUBIN DIRECT: 0.2 mg/dL (ref 0.0–0.3)
Total Bilirubin: 1.3 mg/dL — ABNORMAL HIGH (ref 0.2–1.2)
Total Protein: 6.9 g/dL (ref 6.0–8.3)

## 2013-10-08 LAB — BASIC METABOLIC PANEL
BUN: 15 mg/dL (ref 6–23)
CALCIUM: 9.1 mg/dL (ref 8.4–10.5)
CO2: 24 meq/L (ref 19–32)
CREATININE: 0.9 mg/dL (ref 0.4–1.5)
Chloride: 103 mEq/L (ref 96–112)
GFR: 121 mL/min (ref >60.00)
GLUCOSE: 83 mg/dL (ref 70–99)
Potassium: 3.6 mEq/L (ref 3.5–5.1)
Sodium: 136 mEq/L (ref 135–145)

## 2013-10-08 LAB — TSH: TSH: 1.52 u[IU]/mL (ref 0.35–4.50)

## 2013-10-12 ENCOUNTER — Encounter: Payer: Federal, State, Local not specified - PPO | Admitting: Family Medicine

## 2013-10-15 ENCOUNTER — Ambulatory Visit (INDEPENDENT_AMBULATORY_CARE_PROVIDER_SITE_OTHER): Payer: PRIVATE HEALTH INSURANCE | Admitting: Family Medicine

## 2013-10-15 ENCOUNTER — Encounter: Payer: Self-pay | Admitting: Family Medicine

## 2013-10-15 VITALS — BP 129/75 | Temp 98.1°F | Ht 65.75 in | Wt 148.0 lb

## 2013-10-15 DIAGNOSIS — J0100 Acute maxillary sinusitis, unspecified: Secondary | ICD-10-CM

## 2013-10-15 MED ORDER — HYDROCODONE-HOMATROPINE 5-1.5 MG/5ML PO SYRP: 5.0000 mL | ORAL_SOLUTION | ORAL | Status: DC | PRN

## 2013-10-15 MED ORDER — AZITHROMYCIN 250 MG PO TABS: ORAL_TABLET | ORAL | Status: DC

## 2013-10-15 NOTE — Progress Notes (Signed)
   Subjective:    Patient ID: Eric Weeks, male    DOB: 1965/12/17, 48 y.o.   MRN: [id number]  HPI Here for 5 days of sinus pressure, bilateral ear pain ,PND, and a dry cough. Using Nyquil.    Review of Systems  Constitutional: Negative.   HENT: Positive for congestion, ear pain, postnasal drip and sinus pressure.   Eyes: Negative.   Respiratory: Positive for cough.        Objective:   Physical Exam  Constitutional: He appears well-developed and well-nourished.  HENT:  Right Ear: External ear normal.  Left Ear: External ear normal.  Nose: Nose normal.  Mouth/Throat: Oropharynx is clear and moist.  Eyes: Conjunctivae are normal.  Pulmonary/Chest: Effort normal and breath sounds normal.  Lymphadenopathy:    He has no cervical adenopathy.          Assessment & Plan:  Add Mucinex.

## 2013-10-15 NOTE — Progress Notes (Signed)
Pre visit review using our clinic review tool, if applicable. No additional management support is needed unless otherwise documented below in the visit note. 

## 2013-10-25 ENCOUNTER — Encounter: Payer: Self-pay | Admitting: Family Medicine

## 2013-10-25 ENCOUNTER — Ambulatory Visit (INDEPENDENT_AMBULATORY_CARE_PROVIDER_SITE_OTHER): Payer: PRIVATE HEALTH INSURANCE | Admitting: Family Medicine

## 2013-10-25 VITALS — BP 130/77 | HR 65 | Temp 98.4°F | Ht 65.75 in | Wt 152.0 lb

## 2013-10-25 DIAGNOSIS — Z0000 Encounter for general adult medical examination without abnormal findings: Secondary | ICD-10-CM

## 2013-10-25 NOTE — Progress Notes (Signed)
Pre visit review using our clinic review tool, if applicable. No additional management support is needed unless otherwise documented below in the visit note. 

## 2013-10-25 NOTE — Progress Notes (Signed)
   Subjective:    Patient ID: Eric Weeks, male    DOB: 1965-05-29, 48 y.o.   MRN: [id number]  HPI 48 yr old male for a cpx. He feels fine. His sinusitis has resolved.    Review of Systems  Constitutional: Negative.   HENT: Negative.   Eyes: Negative.   Respiratory: Negative.   Cardiovascular: Negative.   Gastrointestinal: Negative.   Genitourinary: Negative.   Musculoskeletal: Negative.   Skin: Negative.   Neurological: Negative.   Psychiatric/Behavioral: Negative.        Objective:   Physical Exam  Constitutional: He is oriented to person, place, and time. He appears well-developed and well-nourished. No distress.  HENT:  Head: Normocephalic and atraumatic.  Right Ear: External ear normal.  Left Ear: External ear normal.  Nose: Nose normal.  Mouth/Throat: Oropharynx is clear and moist. No oropharyngeal exudate.  Eyes: Conjunctivae and EOM are normal. Pupils are equal, round, and reactive to light. Right eye exhibits no discharge. Left eye exhibits no discharge. No scleral icterus.  Neck: Neck supple. No JVD present. No tracheal deviation present. No thyromegaly present.  Cardiovascular: Normal rate, regular rhythm, normal heart sounds and intact distal pulses.  Exam reveals no gallop and no friction rub.   No murmur heard. EKG normal   Pulmonary/Chest: Effort normal and breath sounds normal. No respiratory distress. He has no wheezes. He has no rales. He exhibits no tenderness.  Abdominal: Soft. Bowel sounds are normal. He exhibits no distension and no mass. There is no tenderness. There is no rebound and no guarding.  Genitourinary: Rectum normal, prostate normal and penis normal. Guaiac negative stool. No penile tenderness.  Musculoskeletal: Normal range of motion. He exhibits no edema and no tenderness.  Lymphadenopathy:    He has no cervical adenopathy.  Neurological: He is alert and oriented to person, place, and time. He has normal reflexes. No cranial nerve  deficit. He exhibits normal muscle tone. Coordination normal.  Skin: Skin is warm and dry. No rash noted. He is not diaphoretic. No erythema. No pallor.  Psychiatric: He has a normal mood and affect. His behavior is normal. Judgment and thought content normal.          Assessment & Plan:  Well exam.

## 2014-10-25 ENCOUNTER — Other Ambulatory Visit (INDEPENDENT_AMBULATORY_CARE_PROVIDER_SITE_OTHER): Payer: PRIVATE HEALTH INSURANCE

## 2014-10-25 DIAGNOSIS — Z0000 Encounter for general adult medical examination without abnormal findings: Secondary | ICD-10-CM | POA: Diagnosis not present

## 2014-10-25 LAB — HEPATIC FUNCTION PANEL
ALBUMIN: 3.9 g/dL (ref 3.5–5.2)
ALK PHOS: 39 U/L (ref 39–117)
ALT: 13 U/L (ref 0–53)
AST: 22 U/L (ref 0–37)
Bilirubin, Direct: 0.2 mg/dL (ref 0.0–0.3)
TOTAL PROTEIN: 6.4 g/dL (ref 6.0–8.3)
Total Bilirubin: 0.8 mg/dL (ref 0.2–1.2)

## 2014-10-25 LAB — CBC WITH DIFFERENTIAL/PLATELET
BASOS PCT: 0.5 % (ref 0.0–3.0)
Basophils Absolute: 0 10*3/uL (ref 0.0–0.1)
EOS PCT: 2.6 % (ref 0.0–5.0)
Eosinophils Absolute: 0.1 10*3/uL (ref 0.0–0.7)
HEMATOCRIT: 45.8 % (ref 39.0–52.0)
Hemoglobin: 15.5 g/dL (ref 13.0–17.0)
LYMPHS PCT: 26.6 % (ref 12.0–46.0)
Lymphs Abs: 1.3 10*3/uL (ref 0.7–4.0)
MCHC: 33.8 g/dL (ref 30.0–36.0)
MCV: 97.7 fl (ref 78.0–100.0)
MONOS PCT: 7.1 % (ref 3.0–12.0)
Monocytes Absolute: 0.3 10*3/uL (ref 0.1–1.0)
NEUTROS ABS: 3.1 10*3/uL (ref 1.4–7.7)
Neutrophils Relative %: 63.2 % (ref 43.0–77.0)
PLATELETS: 233 10*3/uL (ref 150.0–400.0)
RBC: 4.69 Mil/uL (ref 4.22–5.81)
RDW: 13 % (ref 11.5–15.5)
WBC: 4.9 10*3/uL (ref 4.0–10.5)

## 2014-10-25 LAB — BASIC METABOLIC PANEL
BUN: 17 mg/dL (ref 6–23)
CHLORIDE: 107 meq/L (ref 96–112)
CO2: 26 meq/L (ref 19–32)
Calcium: 9.3 mg/dL (ref 8.4–10.5)
Creatinine, Ser: 1.03 mg/dL (ref 0.40–1.50)
GFR: 97.85 mL/min (ref >60.00)
GLUCOSE: 89 mg/dL (ref 70–99)
Potassium: 4 mEq/L (ref 3.5–5.1)
SODIUM: 142 meq/L (ref 135–145)

## 2014-10-25 LAB — POCT URINALYSIS DIPSTICK
BILIRUBIN UA: NEGATIVE
Blood, UA: NEGATIVE
Glucose, UA: NEGATIVE
Ketones, UA: NEGATIVE
LEUKOCYTES UA: NEGATIVE
NITRITE UA: NEGATIVE
PH UA: 6
Spec Grav, UA: >=1.03
Urobilinogen, UA: 0.2

## 2014-10-25 LAB — LIPID PANEL
CHOLESTEROL: 192 mg/dL (ref 0–200)
HDL: 61.7 mg/dL (ref >39.00)
LDL Cholesterol: 120 mg/dL — ABNORMAL HIGH (ref 0–99)
NONHDL: 130.67
TRIGLYCERIDES: 53 mg/dL (ref 0.0–149.0)
Total CHOL/HDL Ratio: 3
VLDL: 10.6 mg/dL (ref 0.0–40.0)

## 2014-10-25 LAB — TSH: TSH: 1.13 u[IU]/mL (ref 0.35–4.50)

## 2014-10-25 LAB — PSA: PSA: 1.13 ng/mL (ref 0.10–4.00)

## 2014-11-01 ENCOUNTER — Ambulatory Visit (INDEPENDENT_AMBULATORY_CARE_PROVIDER_SITE_OTHER): Payer: PRIVATE HEALTH INSURANCE | Admitting: Family Medicine

## 2014-11-01 ENCOUNTER — Encounter: Payer: Self-pay | Admitting: Family Medicine

## 2014-11-01 VITALS — BP 130/71 | HR 67 | Temp 98.2°F | Ht 65.75 in | Wt 150.0 lb

## 2014-11-01 DIAGNOSIS — Z0000 Encounter for general adult medical examination without abnormal findings: Secondary | ICD-10-CM | POA: Diagnosis not present

## 2014-11-01 NOTE — Progress Notes (Signed)
Pre visit review using our clinic review tool, if applicable. No additional management support is needed unless otherwise documented below in the visit note. 

## 2014-11-01 NOTE — Progress Notes (Signed)
   Subjective:    Patient ID: Eric Weeks, male    DOB: 08-09-1965, 49 y.o.   MRN: [id number]  HPI 49 yr old male for a cpx. He feels well but he does mention having to get up a few times during the night to urinate. No discomfort. He tries to watch his diet.    Review of Systems  Constitutional: Negative.   HENT: Negative.   Eyes: Negative.   Respiratory: Negative.   Cardiovascular: Negative.   Gastrointestinal: Negative.   Genitourinary: Negative.   Musculoskeletal: Negative.   Skin: Negative.   Neurological: Negative.   Psychiatric/Behavioral: Negative.        Objective:   Physical Exam  Constitutional: He is oriented to person, place, and time. He appears well-developed and well-nourished. No distress.  HENT:  Head: Normocephalic and atraumatic.  Right Ear: External ear normal.  Left Ear: External ear normal.  Nose: Nose normal.  Mouth/Throat: Oropharynx is clear and moist. No oropharyngeal exudate.  Eyes: Conjunctivae and EOM are normal. Pupils are equal, round, and reactive to light. Right eye exhibits no discharge. Left eye exhibits no discharge. No scleral icterus.  Neck: Neck supple. No JVD present. No tracheal deviation present. No thyromegaly present.  Cardiovascular: Normal rate, regular rhythm, normal heart sounds and intact distal pulses.  Exam reveals no gallop and no friction rub.   No murmur heard. EKG normal   Pulmonary/Chest: Effort normal and breath sounds normal. No respiratory distress. He has no wheezes. He has no rales. He exhibits no tenderness.  Abdominal: Soft. Bowel sounds are normal. He exhibits no distension and no mass. There is no tenderness. There is no rebound and no guarding.  Genitourinary: Rectum normal and penis normal. Guaiac negative stool. No penile tenderness.  Prostate mildly enlarged but smooth and not tender   Musculoskeletal: Normal range of motion. He exhibits no edema or tenderness.  Lymphadenopathy:    He has no cervical  adenopathy.  Neurological: He is alert and oriented to person, place, and time. He has normal reflexes. No cranial nerve deficit. He exhibits normal muscle tone. Coordination normal.  Skin: Skin is warm and dry. No rash noted. He is not diaphoretic. No erythema. No pallor.  Psychiatric: He has a normal mood and affect. His behavior is normal. Judgment and thought content normal.          Assessment & Plan:  Well exam. We discussed diet and exercise advice. He has some mild BPH so I suggested he try saw palmetto OTC. Set up a colonoscopy.

## 2014-12-02 ENCOUNTER — Telehealth: Payer: Self-pay | Admitting: Family Medicine

## 2014-12-02 NOTE — Telephone Encounter (Signed)
Patient would like a referral for an endoscopy.

## 2014-12-03 NOTE — Telephone Encounter (Signed)
[facility] attempted to contact patient on 11/04/14. LM for patient to call back. I called patient and also LMOM today with GI's #.

## 2014-12-03 NOTE — Telephone Encounter (Signed)
I already did a referral on 11-01-14. Please check with GI to see what happened

## 2014-12-09 ENCOUNTER — Encounter: Payer: Self-pay | Admitting: Internal Medicine

## 2015-01-14 ENCOUNTER — Ambulatory Visit: Payer: PRIVATE HEALTH INSURANCE | Admitting: Family Medicine

## 2015-01-16 ENCOUNTER — Ambulatory Visit: Payer: PRIVATE HEALTH INSURANCE | Admitting: Family Medicine

## 2015-01-23 ENCOUNTER — Ambulatory Visit (INDEPENDENT_AMBULATORY_CARE_PROVIDER_SITE_OTHER): Payer: No Typology Code available for payment source | Admitting: Family Medicine

## 2015-01-23 ENCOUNTER — Encounter: Payer: Self-pay | Admitting: Family Medicine

## 2015-01-23 VITALS — BP 114/76 | HR 77 | Temp 98.2°F | Ht 65.75 in | Wt 150.0 lb

## 2015-01-23 DIAGNOSIS — L731 Pseudofolliculitis barbae: Secondary | ICD-10-CM

## 2015-01-23 MED ORDER — TRETINOIN 0.05 % EX GEL: 1.0000 "application " | Freq: Every day | CUTANEOUS | Status: DC

## 2015-01-23 MED ORDER — FLUOCINONIDE 0.05 % EX GEL: 1.0000 "application " | Freq: Two times a day (BID) | CUTANEOUS | Status: DC

## 2015-01-23 NOTE — Progress Notes (Signed)
   Subjective:    Patient ID: Eric Weeks, male    DOB: 1965/01/17, 50 y.o.   MRN: [id number]  HPI Here for a ras on the neck that appeared 6 months ago but will not go away. He has tried OTC cortisone cream with no success. The rash itches but is not painful.    Review of Systems  Constitutional: Negative.   Skin: Positive for rash.       Objective:   Physical Exam  Constitutional: He appears well-developed and well-nourished.  Lymphadenopathy:    He has no cervical adenopathy.  Skin:  There is an area on the right anterior neck in the beard line with macular and papular erythematous areas           Assessment & Plan:  Pseudofolliculitis barbae, treat with Tretinoin gel and Fluocinonide gel.

## 2015-01-23 NOTE — Progress Notes (Signed)
Pre visit review using our clinic review tool, if applicable. No additional management support is needed unless otherwise documented below in the visit note. 

## 2015-02-06 ENCOUNTER — Ambulatory Visit: Payer: PRIVATE HEALTH INSURANCE | Admitting: Internal Medicine

## 2015-02-20 ENCOUNTER — Telehealth: Payer: Self-pay | Admitting: Family Medicine

## 2015-02-20 MED ORDER — DOXYCYCLINE HYCLATE 100 MG PO TABS: 100.0000 mg | ORAL_TABLET | Freq: Two times a day (BID) | ORAL | Status: DC

## 2015-02-20 NOTE — Telephone Encounter (Signed)
Pt was seen on 01-23-15 and still has rash around his neck and would like abx send to [facility] [address]

## 2015-02-20 NOTE — Telephone Encounter (Signed)
Rx called in to pharmacy. Pt is aware.  

## 2015-02-20 NOTE — Telephone Encounter (Signed)
Call in Doxycycline 100 mg bid #60 with no rf

## 2015-03-19 ENCOUNTER — Other Ambulatory Visit: Payer: Self-pay | Admitting: Family Medicine

## 2015-04-04 ENCOUNTER — Ambulatory Visit (INDEPENDENT_AMBULATORY_CARE_PROVIDER_SITE_OTHER): Payer: No Typology Code available for payment source | Admitting: Internal Medicine

## 2015-04-04 ENCOUNTER — Encounter: Payer: Self-pay | Admitting: Internal Medicine

## 2015-04-04 VITALS — BP 120/70 | HR 78 | Ht 66.0 in | Wt 149.0 lb

## 2015-04-04 DIAGNOSIS — Z1211 Encounter for screening for malignant neoplasm of colon: Secondary | ICD-10-CM | POA: Diagnosis not present

## 2015-04-04 DIAGNOSIS — R143 Flatulence: Secondary | ICD-10-CM | POA: Diagnosis not present

## 2015-04-04 MED ORDER — NA SULFATE-K SULFATE-MG SULF 17.5-3.13-1.6 GM/177ML PO SOLN: 1.0000 | Freq: Once | ORAL | Status: DC

## 2015-04-04 NOTE — Progress Notes (Signed)
HISTORY OF PRESENT ILLNESS:  Eric Weeks is a 50 y.o. male , [employer], television/film setoperator, who is referred by his primary care physician Dr. Gershon CraneStephen Fry with chief complaint of bloating with intestinal gas, the need for screening colonoscopy, and possible need for upper endoscopy. Patient has no significant chronic medical problems. He reports to me several year history problems with bloating and belching as well as passing flatus. There is no associated abdominal discomfort or change in bowel habits. He cannot identify any exacerbating or relieving factors. Problem is more bothersome for his wife that it is for himself. She thought he might need upper endoscopy. The patient denies pyrosis, regurgitation, dysphagia, abdominal pain, or weight loss. No family history of gastric cancer. His bowel habits are regular without bleeding. No family history of colon cancer.  REVIEW OF SYSTEMS:  All non-GI ROS negative upon complete comprehensive review  Past Medical History  Diagnosis Date  . Epiretinal membrane     sees [facility] [address]   . Hyperlipidemia     Past Surgical History  Procedure Laterality Date  . Vasectomy  03-07-09    per Dr. Su GrandMarc Nesi  . Circumcision      Social History Eric SabalFelix A Alpern  reports that he has never smoked. He has never used smokeless tobacco. He reports that he does not drink alcohol or use illicit drugs.  family history includes Breast cancer in his maternal aunt. There is no history of Colon cancer.  No Known Allergies     PHYSICAL EXAMINATION: Vital signs: BP 120/70 mmHg  Pulse 78  Ht 5\' 6"  (1.676 m)  Wt 149 lb (67.586 kg)  BMI 24.06 kg/m2  Constitutional: generally well-appearing, no acute distress Psychiatric: alert and oriented x3, cooperative Eyes: extraocular movements intact, anicteric, conjunctiva pink Mouth: oral pharynx moist, no lesions Neck: supple no lymphadenopathy Cardiovascular: heart regular rate and rhythm, no murmur Lungs: clear  to auscultation bilaterally Abdomen: soft, nontender, nondistended, no obvious ascites, no peritoneal signs, normal bowel sounds, no organomegaly Rectal: Extremities: no lower extremity edema bilaterally Skin: no lesions on visible extremities Neuro: No focal deficits. No asterixis.    ASSESSMENT:  #1. Increased intestinal gas. No alarm features #2. Screening for colon cancer. Baseline risk #3. No indication for EGD   PLAN:  #1. Educational discussion on intestinal gas. The etiology and significance. #2. Provided with literature on intestinal gas for review #3. Discussed no indication for EGD. Patient understands #4. Schedule screening colonoscopy.The nature of the procedure, as well as the risks, benefits, and alternatives were carefully and thoroughly reviewed with the patient. Ample time for discussion and questions allowed. The patient understood, was satisfied, and agreed to proceed.    A copy of this consultation note has been sent to Dr. Clent RidgesFry

## 2015-04-04 NOTE — Patient Instructions (Signed)
You have been scheduled for a colonoscopy. Please follow written instructions given to you at your visit today.  Please pick up your prep supplies at the pharmacy within the next 1-3 days. If you use inhalers (even only as needed), please bring them with you on the day of your procedure. Your physician has requested that you go to www.startemmi.com and enter the access code given to you at your visit today. This web site gives a general overview about your procedure. However, you should still follow specific instructions given to you by our office regarding your preparation for the procedure.  Thank you for choosing me and [facility].  Malcolm T. Stark, Jr., MD., FACG  

## 2015-05-15 ENCOUNTER — Encounter: Payer: No Typology Code available for payment source | Admitting: Internal Medicine

## 2015-06-09 ENCOUNTER — Telehealth: Payer: Self-pay | Admitting: Internal Medicine

## 2015-06-09 DIAGNOSIS — R109 Unspecified abdominal pain: Secondary | ICD-10-CM

## 2015-06-09 NOTE — Telephone Encounter (Signed)
Pt calling wanting to add on EGD with colon. Pt states he has been having issues with his stomach "churning" and he has been coughing up a lot of mucous like material that he feels is from his stomach. Pt also states he has abdominal pain at times. Please advise if ok to schedule EGD along with colon.

## 2015-06-09 NOTE — Telephone Encounter (Signed)
Ok to add for abdominal pain. Not other complaints

## 2015-06-10 NOTE — Telephone Encounter (Signed)
Pt scheduled for ECL in the [facility] 07/03/15@2pm . Updated instructions mailed to pt.

## 2015-06-24 ENCOUNTER — Encounter: Payer: Self-pay | Admitting: Internal Medicine

## 2015-06-27 ENCOUNTER — Encounter: Payer: No Typology Code available for payment source | Admitting: Internal Medicine

## 2015-07-03 ENCOUNTER — Encounter: Payer: Self-pay | Admitting: Internal Medicine

## 2015-07-03 ENCOUNTER — Ambulatory Visit (AMBULATORY_SURGERY_CENTER): Payer: No Typology Code available for payment source | Admitting: Internal Medicine

## 2015-07-03 VITALS — BP 101/71 | HR 72 | Temp 99.1°F | Resp 14 | Ht 66.0 in | Wt 149.0 lb

## 2015-07-03 DIAGNOSIS — K2100 Gastro-esophageal reflux disease with esophagitis, without bleeding: Secondary | ICD-10-CM

## 2015-07-03 DIAGNOSIS — R142 Eructation: Secondary | ICD-10-CM

## 2015-07-03 DIAGNOSIS — R141 Gas pain: Secondary | ICD-10-CM

## 2015-07-03 DIAGNOSIS — Z1211 Encounter for screening for malignant neoplasm of colon: Secondary | ICD-10-CM

## 2015-07-03 DIAGNOSIS — R143 Flatulence: Secondary | ICD-10-CM

## 2015-07-03 HISTORY — PX: ESOPHAGOGASTRODUODENOSCOPY: SHX1529

## 2015-07-03 HISTORY — PX: COLONOSCOPY: SHX174

## 2015-07-03 MED ORDER — SODIUM CHLORIDE 0.9 % IV SOLN: 500.0000 mL | INTRAVENOUS | Status: DC

## 2015-07-03 MED ORDER — OMEPRAZOLE 20 MG PO CPDR: 20.0000 mg | DELAYED_RELEASE_CAPSULE | Freq: Every day | ORAL | Status: DC

## 2015-07-03 NOTE — Patient Instructions (Signed)
YOU HAD AN ENDOSCOPIC PROCEDURE TODAY AT THE [facility]:   Refer to the procedure report that was given to you for any specific questions about what was found during the examination.  If the procedure report does not answer your questions, please call your gastroenterologist to clarify.  If you requested that your care partner not be given the details of your procedure findings, then the procedure report has been included in a sealed envelope for you to review at your convenience later.  YOU SHOULD EXPECT: Some feelings of bloating in the abdomen. Passage of more gas than usual.  Walking can help get rid of the air that was put into your GI tract during the procedure and reduce the bloating. If you had a lower endoscopy (such as a colonoscopy or flexible sigmoidoscopy) you may notice spotting of blood in your stool or on the toilet paper. If you underwent a bowel prep for your procedure, you may not have a normal bowel movement for a few days.  Please Note:  You might notice some irritation and congestion in your nose or some drainage.  This is from the oxygen used during your procedure.  There is no need for concern and it should clear up in a day or so.  SYMPTOMS TO REPORT IMMEDIATELY:   Following lower endoscopy (colonoscopy or flexible sigmoidoscopy):  Excessive amounts of blood in the stool  Significant tenderness or worsening of abdominal pains  Swelling of the abdomen that is new, acute  Fever of 100F or higher   Following upper endoscopy (EGD)  Vomiting of blood or coffee ground material  New chest pain or pain under the shoulder blades  Painful or persistently difficult swallowing  New shortness of breath  Fever of 100F or higher  Black, tarry-looking stools  For urgent or emergent issues, a gastroenterologist can be reached at any hour by calling [phone].   DIET: Your first meal following the procedure should be a small meal and then it is ok to progress to  your normal diet. Heavy or fried foods are harder to digest and may make you feel nauseous or bloated.  Likewise, meals heavy in dairy and vegetables can increase bloating.  Drink plenty of fluids but you should avoid alcoholic beverages for 24 hours.  ACTIVITY:  You should plan to take it easy for the rest of today and you should NOT DRIVE or use heavy machinery until tomorrow (because of the sedation medicines used during the test).    FOLLOW UP: Our staff will call the number listed on your records the next business day following your procedure to check on you and address any questions or concerns that you may have regarding the information given to you following your procedure. If we do not reach you, we will leave a message.  However, if you are feeling well and you are not experiencing any problems, there is no need to return our call.  We will assume that you have returned to your regular daily activities without incident.  If any biopsies were taken you will be contacted by phone or by letter within the next 1-3 weeks.  Please call us at [phone] if you have not heard about the biopsies in 3 weeks.    SIGNATURES/CONFIDENTIALITY: You and/or your care partner have signed paperwork which will be entered into your electronic medical record.  These signatures attest to the fact that that the information above on your After Visit Summary has been reviewed   and is understood.  Full responsibility of the confidentiality of this discharge information lies with you and/or your care-partner. 

## 2015-07-03 NOTE — Op Note (Signed)
[facility] Patient Name: Eric Weeks Procedure Date: 07/03/2015 1:37 PM MRN: [id number] Endoscopist: Wilhemina BonitoJohn N. Marina GoodellPerry , MD Age: [age] Referring MD:  Date of Birth: 22-Dec-1965 Gender: Male Account #: [id number] Procedure:                Upper GI endoscopy Indications:              Dyspepsia Medicines:                Monitored Anesthesia Care Procedure:                Pre-Anesthesia Assessment:                           - Prior to the procedure, a History and Physical                            was performed, and patient medications and                            allergies were reviewed. The patient's tolerance of                            previous anesthesia was also reviewed. The risks                            and benefits of the procedure and the sedation                            options and risks were discussed with the patient.                            All questions were answered, and informed consent                            was obtained. Prior Anticoagulants: The patient has                            taken no previous anticoagulant or antiplatelet                            agents. ASA Grade Assessment: I - A normal, healthy                            patient. After reviewing the risks and benefits,                            the patient was deemed in satisfactory condition to                            undergo the procedure.                           - Prior to the procedure, a History and Physical  was performed, and patient medications and                            allergies were reviewed. The patient's tolerance of                            previous anesthesia was also reviewed. The risks                            and benefits of the procedure and the sedation                            options and risks were discussed with the patient.                            All questions were answered, and informed consent             was obtained. Prior Anticoagulants: The patient has                            taken no previous anticoagulant or antiplatelet                            agents. ASA Grade Assessment: I - A normal, healthy                            patient. After reviewing the risks and benefits,                            the patient was deemed in satisfactory condition to                            undergo the procedure.                           After obtaining informed consent, the endoscope was                            passed under direct vision. Throughout the                            procedure, the patient's blood pressure, pulse, and                            oxygen saturations were monitored continuously. The                            Model GIF-[id number]) scope was introduced                            through the mouth, and advanced to the second part                            of duodenum. The upper GI endoscopy  was                            accomplished without difficulty. The patient                            tolerated the procedure well. Scope In: Scope Out: Findings:                 Mild reflux esophagitis was found, as manifested by                            mild edema and friability at the Z line.                           The exam of the esophagus was otherwise normal.                           The entire examined stomach was normal.                           The examined duodenum was normal.                           The cardia and gastric fundus were normal on                            retroflexion. Complications:            No immediate complications. Estimated Blood Loss:     Estimated blood loss: none. Impression:               - Mild reflux esophagitis.                           - Normal stomach.                           - Normal examined duodenum.                           - No specimens collected. Recommendation:           - Reflux precautions                            - Prescribe omeprazole 20 mg daily; #30; 3 refills.                           - Resume previous diet.                           - GI follow-up as needed. Resume primary care with                            Dr. Clent Ridges. [name]. Marina Goodell, MD 07/03/2015 2:11:42 PM This report has been signed electronically.

## 2015-07-03 NOTE — Progress Notes (Signed)
A and O x3. Report to RN. Tolerated MAC anesthesia well. 

## 2015-07-03 NOTE — Op Note (Signed)
[facility] Patient Name: Eric Weeks Procedure Date: 07/03/2015 1:37 PM MRN: [id number] Endoscopist: Eric Weeks. Eric Weeks , MD Age: 50 Referring MD:  Date of Birth: Jul 30, 1965 Gender: Male Account #: [id number] Procedure:                Colonoscopy Indications:              Screening for colorectal malignant neoplasm Medicines:                Monitored Anesthesia Care Procedure:                Pre-Anesthesia Assessment:                           - Prior to the procedure, a History and Physical                            was performed, and patient medications and                            allergies were reviewed. The patient's tolerance of                            previous anesthesia was also reviewed. The risks                            and benefits of the procedure and the sedation                            options and risks were discussed with the patient.                            All questions were answered, and informed consent                            was obtained. Prior Anticoagulants: The patient has                            taken no previous anticoagulant or antiplatelet                            agents. ASA Grade Assessment: I - A normal, healthy                            patient. After reviewing the risks and benefits,                            the patient was deemed in satisfactory condition to                            undergo the procedure.                           After obtaining informed consent, the colonoscope  was passed under direct vision. Throughout the                            procedure, the patient's blood pressure, pulse, and                            oxygen saturations were monitored continuously. The                            Model CF-HQ190L [id number](SN#[id number]) scope was introduced                            through the anus and advanced to the the cecum,                            identified by appendiceal  orifice and ileocecal                            valve. The ileocecal valve, appendiceal orifice,                            and rectum were photographed. The quality of the                            bowel preparation was excellent. The colonoscopy                            was performed without difficulty. The patient                            tolerated the procedure well. The bowel preparation                            used was SUPREP. Scope In: 1:47:28 PM Scope Out: 1:59:17 PM Scope Withdrawal Time: 0 hours 9 minutes 11 seconds  Total Procedure Duration: 0 hours 11 minutes 49 seconds  Findings:                 Internal hemorrhoids were found during retroflexion.                           The exam was otherwise without abnormality on                            direct and retroflexion views. Complications:            No immediate complications. Estimated blood loss:                            None. Estimated Blood Loss:     Estimated blood loss: none. Impression:               - Internal hemorrhoids.                           - The examination was otherwise normal on direct  and retroflexion views.                           - No specimens collected. Recommendation:           - Repeat colonoscopy in 10 years for screening                            purposes.                           - Patient has a contact number available for                            emergencies. The signs and symptoms of potential                            delayed complications were discussed with the                            patient. Return to normal activities tomorrow.                            Written discharge instructions were provided to the                            patient.                           - Resume previous diet.                           - Continue present medications. Eric BonitoJohn N. Eric GoodellPerry, MD 07/03/2015 2:02:12 PM This report has been signed electronically.

## 2015-07-04 ENCOUNTER — Telehealth: Payer: Self-pay | Admitting: *Deleted

## 2015-07-04 NOTE — Telephone Encounter (Signed)
  Follow up Call-  Call back number 07/03/2015  Post procedure Call Back phone  # [phone]  Permission to leave phone message Yes     Patient questions:  Do you have a fever, pain , or abdominal swelling? No. Pain Score  0 *  Have you tolerated food without any problems? Yes.    Have you been able to return to your normal activities? Yes.    Do you have any questions about your discharge instructions: Diet   No. Medications  No. Follow up visit  No.  Do you have questions or concerns about your Care? No.  Actions: * If pain score is 4 or above: No action needed, pain <4.

## 2015-10-27 ENCOUNTER — Other Ambulatory Visit: Payer: PRIVATE HEALTH INSURANCE

## 2015-10-31 ENCOUNTER — Encounter: Payer: No Typology Code available for payment source | Admitting: Family Medicine

## 2015-11-03 ENCOUNTER — Encounter: Payer: PRIVATE HEALTH INSURANCE | Admitting: Family Medicine

## 2015-11-14 ENCOUNTER — Other Ambulatory Visit (INDEPENDENT_AMBULATORY_CARE_PROVIDER_SITE_OTHER): Payer: No Typology Code available for payment source

## 2015-11-14 DIAGNOSIS — Z0000 Encounter for general adult medical examination without abnormal findings: Secondary | ICD-10-CM | POA: Diagnosis not present

## 2015-11-14 LAB — BASIC METABOLIC PANEL
BUN: 16 mg/dL (ref 6–23)
CALCIUM: 9.6 mg/dL (ref 8.4–10.5)
CO2: 28 mEq/L (ref 19–32)
CREATININE: 1.12 mg/dL (ref 0.40–1.50)
Chloride: 105 mEq/L (ref 96–112)
GFR: 88.46 mL/min (ref >60.00)
Glucose, Bld: 91 mg/dL (ref 70–99)
Potassium: 4.4 mEq/L (ref 3.5–5.1)
SODIUM: 141 meq/L (ref 135–145)

## 2015-11-14 LAB — POC URINALSYSI DIPSTICK (AUTOMATED)
Bilirubin, UA: NEGATIVE
Blood, UA: NEGATIVE
Glucose, UA: NEGATIVE
Ketones, UA: NEGATIVE
LEUKOCYTES UA: NEGATIVE
Nitrite, UA: NEGATIVE
Spec Grav, UA: 1.025
UROBILINOGEN UA: 0.2
pH, UA: 5.5

## 2015-11-14 LAB — TSH: TSH: 1.29 u[IU]/mL (ref 0.35–4.50)

## 2015-11-14 LAB — CBC WITH DIFFERENTIAL/PLATELET
BASOS PCT: 0.3 % (ref 0.0–3.0)
Basophils Absolute: 0 10*3/uL (ref 0.0–0.1)
EOS PCT: 1.7 % (ref 0.0–5.0)
Eosinophils Absolute: 0.1 10*3/uL (ref 0.0–0.7)
HCT: 45.5 % (ref 39.0–52.0)
HEMOGLOBIN: 15.9 g/dL (ref 13.0–17.0)
LYMPHS ABS: 1.3 10*3/uL (ref 0.7–4.0)
Lymphocytes Relative: 24.8 % (ref 12.0–46.0)
MCHC: 34.8 g/dL (ref 30.0–36.0)
MCV: 95.2 fl (ref 78.0–100.0)
MONO ABS: 0.3 10*3/uL (ref 0.1–1.0)
Monocytes Relative: 6.3 % (ref 3.0–12.0)
NEUTROS ABS: 3.5 10*3/uL (ref 1.4–7.7)
Neutrophils Relative %: 66.9 % (ref 43.0–77.0)
Platelets: 264 10*3/uL (ref 150.0–400.0)
RBC: 4.78 Mil/uL (ref 4.22–5.81)
RDW: 12.9 % (ref 11.5–15.5)
WBC: 5.2 10*3/uL (ref 4.0–10.5)

## 2015-11-14 LAB — PSA: PSA: 2.42 ng/mL (ref 0.10–4.00)

## 2015-11-14 LAB — HEPATIC FUNCTION PANEL
ALT: 10 U/L (ref 0–53)
AST: 18 U/L (ref 0–37)
Albumin: 4.1 g/dL (ref 3.5–5.2)
Alkaline Phosphatase: 47 U/L (ref 39–117)
BILIRUBIN TOTAL: 1 mg/dL (ref 0.2–1.2)
Bilirubin, Direct: 0.2 mg/dL (ref 0.0–0.3)
Total Protein: 6.5 g/dL (ref 6.0–8.3)

## 2015-11-14 LAB — LIPID PANEL
CHOLESTEROL: 196 mg/dL (ref 0–200)
HDL: 70.1 mg/dL (ref >39.00)
LDL CALC: 114 mg/dL — AB (ref 0–99)
NonHDL: 125.41
TRIGLYCERIDES: 56 mg/dL (ref 0.0–149.0)
Total CHOL/HDL Ratio: 3
VLDL: 11.2 mg/dL (ref 0.0–40.0)

## 2015-11-21 ENCOUNTER — Ambulatory Visit (INDEPENDENT_AMBULATORY_CARE_PROVIDER_SITE_OTHER): Payer: No Typology Code available for payment source | Admitting: Family Medicine

## 2015-11-21 ENCOUNTER — Encounter: Payer: Self-pay | Admitting: Family Medicine

## 2015-11-21 VITALS — BP 108/68 | HR 66 | Temp 98.4°F | Ht 66.0 in | Wt 150.0 lb

## 2015-11-21 DIAGNOSIS — L509 Urticaria, unspecified: Secondary | ICD-10-CM | POA: Diagnosis not present

## 2015-11-21 DIAGNOSIS — Z0000 Encounter for general adult medical examination without abnormal findings: Secondary | ICD-10-CM | POA: Diagnosis not present

## 2015-11-21 NOTE — Progress Notes (Signed)
Pre visit review using our clinic review tool, if applicable. No additional management support is needed unless otherwise documented below in the visit note. 

## 2015-11-21 NOTE — Progress Notes (Signed)
   Subjective:    Patient ID: Eric Weeks, male    DOB: 13-Apr-1965, 50 y.o.   MRN: [id number]  HPI 50 yr old male for a well exam. His only concern is a 2 month hx of intermittent itching all over his body. This bothers him mostly at night. At baseline there is no visible rash, but after he scratches himself whelps appear in the area he has scratched. No new medications or OTC supplements.    Review of Systems  Constitutional: Negative.   HENT: Negative.   Eyes: Negative.   Respiratory: Negative.   Cardiovascular: Negative.   Gastrointestinal: Negative.   Genitourinary: Negative.   Musculoskeletal: Negative.   Skin: Positive for rash.  Neurological: Negative.   Psychiatric/Behavioral: Negative.        Objective:   Physical Exam  Constitutional: He is oriented to person, place, and time. He appears well-developed and well-nourished. No distress.  HENT:  Head: Normocephalic and atraumatic.  Right Ear: External ear normal.  Left Ear: External ear normal.  Nose: Nose normal.  Mouth/Throat: Oropharynx is clear and moist. No oropharyngeal exudate.  Eyes: Conjunctivae and EOM are normal. Pupils are equal, round, and reactive to light. Right eye exhibits no discharge. Left eye exhibits no discharge. No scleral icterus.  Neck: Neck supple. No JVD present. No tracheal deviation present. No thyromegaly present.  Cardiovascular: Normal rate, regular rhythm, normal heart sounds and intact distal pulses.  Exam reveals no gallop and no friction rub.   No murmur heard. Pulmonary/Chest: Effort normal and breath sounds normal. No respiratory distress. He has no wheezes. He has no rales. He exhibits no tenderness.  Abdominal: Soft. Bowel sounds are normal. He exhibits no distension and no mass. There is no tenderness. There is no rebound and no guarding.  Genitourinary: Rectum normal, prostate normal and penis normal. Rectal exam shows guaiac negative stool. No penile tenderness.    Musculoskeletal: Normal range of motion. He exhibits no edema or tenderness.  Lymphadenopathy:    He has no cervical adenopathy.  Neurological: He is alert and oriented to person, place, and time. He has normal reflexes. No cranial nerve deficit. He exhibits normal muscle tone. Coordination normal.  Skin: Skin is warm and dry. No rash noted. He is not diaphoretic. No erythema. No pallor.  Psychiatric: He has a normal mood and affect. His behavior is normal. Judgment and thought content normal.          Assessment & Plan:  Well exam. We discussed diet and exercise. He has urticaria with no clear etiology. He will treat it by taking Zyrtec 10 mg bid and Zantac 75 mg bid for a few weeks.  Nelwyn SalisburyFRY,STEPHEN A, MD

## 2016-04-15 ENCOUNTER — Other Ambulatory Visit: Payer: Self-pay | Admitting: Family Medicine

## 2016-12-03 ENCOUNTER — Encounter: Payer: No Typology Code available for payment source | Admitting: Family Medicine

## 2016-12-17 ENCOUNTER — Ambulatory Visit (INDEPENDENT_AMBULATORY_CARE_PROVIDER_SITE_OTHER): Payer: No Typology Code available for payment source | Admitting: Family Medicine

## 2016-12-17 ENCOUNTER — Encounter: Payer: Self-pay | Admitting: Family Medicine

## 2016-12-17 VITALS — BP 118/80 | HR 73 | Temp 98.4°F | Ht 66.0 in | Wt 154.6 lb

## 2016-12-17 DIAGNOSIS — Z0000 Encounter for general adult medical examination without abnormal findings: Secondary | ICD-10-CM | POA: Diagnosis not present

## 2016-12-17 LAB — HEPATIC FUNCTION PANEL
ALK PHOS: 45 U/L (ref 39–117)
ALT: 12 U/L (ref 0–53)
AST: 21 U/L (ref 0–37)
Albumin: 4.3 g/dL (ref 3.5–5.2)
BILIRUBIN DIRECT: 0.1 mg/dL (ref 0.0–0.3)
TOTAL PROTEIN: 6.7 g/dL (ref 6.0–8.3)
Total Bilirubin: 0.8 mg/dL (ref 0.2–1.2)

## 2016-12-17 LAB — BASIC METABOLIC PANEL
BUN: 11 mg/dL (ref 6–23)
CALCIUM: 9.3 mg/dL (ref 8.4–10.5)
CHLORIDE: 103 meq/L (ref 96–112)
CO2: 32 meq/L (ref 19–32)
Creatinine, Ser: 1.06 mg/dL (ref 0.40–1.50)
GFR: 93.87 mL/min (ref >60.00)
GLUCOSE: 80 mg/dL (ref 70–99)
Potassium: 4.4 mEq/L (ref 3.5–5.1)
SODIUM: 139 meq/L (ref 135–145)

## 2016-12-17 LAB — POC URINALSYSI DIPSTICK (AUTOMATED)
BILIRUBIN UA: NEGATIVE
Blood, UA: NEGATIVE
GLUCOSE UA: NEGATIVE
KETONES UA: NEGATIVE
Leukocytes, UA: NEGATIVE
Nitrite, UA: NEGATIVE
Protein, UA: NEGATIVE
SPEC GRAV UA: 1.015 (ref 1.010–1.025)
Urobilinogen, UA: 0.2 E.U./dL
pH, UA: 6 (ref 5.0–8.0)

## 2016-12-17 LAB — LIPID PANEL
CHOLESTEROL: 187 mg/dL (ref 0–200)
HDL: 68 mg/dL (ref >39.00)
LDL Cholesterol: 108 mg/dL — ABNORMAL HIGH (ref 0–99)
NonHDL: 119.4
Total CHOL/HDL Ratio: 3
Triglycerides: 55 mg/dL (ref 0.0–149.0)
VLDL: 11 mg/dL (ref 0.0–40.0)

## 2016-12-17 LAB — TSH: TSH: 1.82 u[IU]/mL (ref 0.35–4.50)

## 2016-12-17 LAB — CBC WITH DIFFERENTIAL/PLATELET
BASOS PCT: 0.6 % (ref 0.0–3.0)
Basophils Absolute: 0 10*3/uL (ref 0.0–0.1)
EOS PCT: 1.9 % (ref 0.0–5.0)
Eosinophils Absolute: 0.1 10*3/uL (ref 0.0–0.7)
HCT: 46.6 % (ref 39.0–52.0)
Hemoglobin: 16.1 g/dL (ref 13.0–17.0)
LYMPHS ABS: 1.1 10*3/uL (ref 0.7–4.0)
Lymphocytes Relative: 24.2 % (ref 12.0–46.0)
MCHC: 34.5 g/dL (ref 30.0–36.0)
MCV: 96.6 fl (ref 78.0–100.0)
MONO ABS: 0.3 10*3/uL (ref 0.1–1.0)
MONOS PCT: 6 % (ref 3.0–12.0)
NEUTROS ABS: 3.2 10*3/uL (ref 1.4–7.7)
NEUTROS PCT: 67.3 % (ref 43.0–77.0)
Platelets: 268 10*3/uL (ref 150.0–400.0)
RBC: 4.83 Mil/uL (ref 4.22–5.81)
RDW: 12.6 % (ref 11.5–15.5)
WBC: 4.7 10*3/uL (ref 4.0–10.5)

## 2016-12-17 LAB — PSA: PSA: 2.37 ng/mL (ref 0.10–4.00)

## 2016-12-17 NOTE — Progress Notes (Signed)
   Subjective:    Patient ID: Eric Weeks, male    DOB: 08-Feb-1965, 51 y.o.   MRN: [id number]  HPI Here for a well exam. He has several issues to discuss. First he has had an intermittent mild pain in the right lower back for a month. When it flares up, he takes Ibuprofen and gets good relief. No hx of trauma. No radiation to the legs. Also for 2 months he has had an intermittent dry cough. No SOB or wheezing. He has never smoked. He rarely gets heartburn.    Review of Systems  Constitutional: Negative.   HENT: Negative.   Eyes: Negative.   Respiratory: Positive for cough. Negative for apnea, choking, chest tightness, shortness of breath, wheezing and stridor.   Cardiovascular: Negative.   Gastrointestinal: Negative.   Genitourinary: Negative.   Musculoskeletal: Positive for back pain. Negative for arthralgias, joint swelling, neck pain and neck stiffness.  Skin: Negative.   Neurological: Negative.   Psychiatric/Behavioral: Negative.        Objective:   Physical Exam  Constitutional: He is oriented to person, place, and time. He appears well-developed and well-nourished. No distress.  HENT:  Head: Normocephalic and atraumatic.  Right Ear: External ear normal.  Left Ear: External ear normal.  Nose: Nose normal.  Mouth/Throat: Oropharynx is clear and moist. No oropharyngeal exudate.  Eyes: Conjunctivae and EOM are normal. Pupils are equal, round, and reactive to light. Right eye exhibits no discharge. Left eye exhibits no discharge. No scleral icterus.  Neck: Neck supple. No JVD present. No tracheal deviation present. No thyromegaly present.  Cardiovascular: Normal rate, regular rhythm, normal heart sounds and intact distal pulses. Exam reveals no gallop and no friction rub.  No murmur heard. Pulmonary/Chest: Effort normal and breath sounds normal. No respiratory distress. He has no wheezes. He has no rales. He exhibits no tenderness.  Abdominal: Soft. Bowel sounds are normal.  He exhibits no distension and no mass. There is no tenderness. There is no rebound and no guarding.  Genitourinary: Rectum normal, prostate normal and penis normal. Rectal exam shows guaiac negative stool. No penile tenderness.  Musculoskeletal: Normal range of motion. He exhibits no edema or tenderness.  Lymphadenopathy:    He has no cervical adenopathy.  Neurological: He is alert and oriented to person, place, and time. He has normal reflexes. No cranial nerve deficit. He exhibits normal muscle tone. Coordination normal.  Skin: Skin is warm and dry. No rash noted. He is not diaphoretic. No erythema. No pallor.  Psychiatric: He has a normal mood and affect. His behavior is normal. Judgment and thought content normal.          Assessment & Plan:  Well exam. We discussed diet and exercise. Get fasting labs. His back pain seems fairly mild. He can use Ibuprofen and follow up prn. His cough may result from GERD so he will try Omeprazole 20 mg OTC daily for a few weeks.  Gershon CraneStephen Johnathon Olden, MD

## 2017-01-20 ENCOUNTER — Other Ambulatory Visit: Payer: Self-pay | Admitting: Internal Medicine

## 2017-01-20 DIAGNOSIS — K2100 Gastro-esophageal reflux disease with esophagitis, without bleeding: Secondary | ICD-10-CM

## 2017-02-10 ENCOUNTER — Telehealth: Payer: Self-pay

## 2017-02-10 NOTE — Telephone Encounter (Signed)
Copied from CRM [id number]. Topic: Inquiry >> Feb 04, 2017  4:24 PM Windy KalataMichael, Taylor L, NT wrote: Patient states he never received lab results from 12/17/16 visit. Pateint would like them emailed to [email or web address] if possible. I also gave patient mychart information to sign up to see results. Please advise.

## 2017-02-11 NOTE — Telephone Encounter (Signed)
Spoke with Pt wife and she confirmed that the [place] [facility]

## 2017-04-04 ENCOUNTER — Ambulatory Visit: Payer: No Typology Code available for payment source | Admitting: Family Medicine

## 2017-05-03 ENCOUNTER — Other Ambulatory Visit: Payer: Self-pay | Admitting: Internal Medicine

## 2017-05-03 DIAGNOSIS — K2100 Gastro-esophageal reflux disease with esophagitis, without bleeding: Secondary | ICD-10-CM

## 2017-05-18 ENCOUNTER — Telehealth: Payer: Self-pay | Admitting: Family Medicine

## 2017-05-18 NOTE — Telephone Encounter (Signed)
Copied from CRM [id number]. Topic: Quick Communication - See Telephone Encounter >> May 18, 2017 11:40 AM Rudi Coco, NT wrote: CRM for notification. See Telephone encounter for: 05/18/17.  Micheal from exam one calling to check status of medical records micheal can be reached at [phone]

## 2017-07-04 ENCOUNTER — Telehealth: Payer: Self-pay | Admitting: *Deleted

## 2017-07-04 NOTE — Telephone Encounter (Signed)
Returned call to CalhounJustin for more info. He reports that they system updated today and all needed info has now been received. Nothing further needed at this time.

## 2017-07-04 NOTE — Telephone Encounter (Signed)
Copied from CRM [id number]. Topic: General - Other >> Jul 04, 2017 11:24 AM Tamela OddiHarris, Brenda J wrote: Reason for CRM: Jill [facility]Justin with [facility] is requesting a physician statement for patient applying for life insurance.  CB# [phone].

## 2017-12-23 ENCOUNTER — Encounter: Payer: No Typology Code available for payment source | Admitting: Family Medicine

## 2018-01-05 ENCOUNTER — Encounter: Payer: Self-pay | Admitting: *Deleted

## 2018-01-05 ENCOUNTER — Encounter: Payer: Self-pay | Admitting: Family Medicine

## 2018-01-05 ENCOUNTER — Ambulatory Visit (INDEPENDENT_AMBULATORY_CARE_PROVIDER_SITE_OTHER): Payer: No Typology Code available for payment source | Admitting: Family Medicine

## 2018-01-05 VITALS — BP 132/86 | HR 88 | Temp 98.5°F | Ht 66.5 in | Wt 151.1 lb

## 2018-01-05 DIAGNOSIS — Z0000 Encounter for general adult medical examination without abnormal findings: Secondary | ICD-10-CM | POA: Diagnosis not present

## 2018-01-05 LAB — CBC WITH DIFFERENTIAL/PLATELET
BASOS ABS: 0 10*3/uL (ref 0.0–0.1)
Basophils Relative: 0.7 % (ref 0.0–3.0)
EOS ABS: 0.1 10*3/uL (ref 0.0–0.7)
Eosinophils Relative: 1.9 % (ref 0.0–5.0)
HEMATOCRIT: 47.6 % (ref 39.0–52.0)
Hemoglobin: 16.6 g/dL (ref 13.0–17.0)
Lymphocytes Relative: 19.6 % (ref 12.0–46.0)
Lymphs Abs: 1 10*3/uL (ref 0.7–4.0)
MCHC: 34.9 g/dL (ref 30.0–36.0)
MCV: 94.4 fl (ref 78.0–100.0)
Monocytes Absolute: 0.3 10*3/uL (ref 0.1–1.0)
Monocytes Relative: 5.8 % (ref 3.0–12.0)
NEUTROS ABS: 3.7 10*3/uL (ref 1.4–7.7)
Neutrophils Relative %: 72 % (ref 43.0–77.0)
PLATELETS: 244 10*3/uL (ref 150.0–400.0)
RBC: 5.04 Mil/uL (ref 4.22–5.81)
RDW: 13.4 % (ref 11.5–15.5)
WBC: 5.2 10*3/uL (ref 4.0–10.5)

## 2018-01-05 LAB — LIPID PANEL
CHOL/HDL RATIO: 3
Cholesterol: 212 mg/dL — ABNORMAL HIGH (ref 0–200)
HDL: 64.5 mg/dL (ref >39.00)
LDL CALC: 130 mg/dL — AB (ref 0–99)
NONHDL: 147.95
TRIGLYCERIDES: 90 mg/dL (ref 0.0–149.0)
VLDL: 18 mg/dL (ref 0.0–40.0)

## 2018-01-05 LAB — HEPATIC FUNCTION PANEL
ALBUMIN: 4.3 g/dL (ref 3.5–5.2)
ALT: 13 U/L (ref 0–53)
AST: 18 U/L (ref 0–37)
Alkaline Phosphatase: 40 U/L (ref 39–117)
Bilirubin, Direct: 0.2 mg/dL (ref 0.0–0.3)
Total Bilirubin: 1.1 mg/dL (ref 0.2–1.2)
Total Protein: 6.7 g/dL (ref 6.0–8.3)

## 2018-01-05 LAB — POC URINALSYSI DIPSTICK (AUTOMATED)
BILIRUBIN UA: NEGATIVE
Glucose, UA: NEGATIVE
Ketones, UA: NEGATIVE
Leukocytes, UA: NEGATIVE
NITRITE UA: NEGATIVE
PH UA: 5.5 (ref 5.0–8.0)
PROTEIN UA: POSITIVE — AB
RBC UA: NEGATIVE
Spec Grav, UA: >=1.03 — AB (ref 1.010–1.025)
UROBILINOGEN UA: 0.2 U/dL

## 2018-01-05 LAB — BASIC METABOLIC PANEL
BUN: 14 mg/dL (ref 6–23)
CALCIUM: 9.6 mg/dL (ref 8.4–10.5)
CO2: 30 mEq/L (ref 19–32)
CREATININE: 1.21 mg/dL (ref 0.40–1.50)
Chloride: 103 mEq/L (ref 96–112)
GFR: 80.26 mL/min (ref >60.00)
Glucose, Bld: 91 mg/dL (ref 70–99)
Potassium: 4.2 mEq/L (ref 3.5–5.1)
Sodium: 139 mEq/L (ref 135–145)

## 2018-01-05 LAB — TSH: TSH: 2.56 u[IU]/mL (ref 0.35–4.50)

## 2018-01-05 LAB — PSA: PSA: 1.75 ng/mL (ref 0.10–4.00)

## 2018-01-05 NOTE — Progress Notes (Signed)
   Subjective:    Patient ID: Eric Weeks, male    DOB: Nov 07, 1965, 53 y.o.   MRN: [id number]  HPI Here for a well exam. He feels great.    Review of Systems  Constitutional: Negative.   HENT: Negative.   Eyes: Negative.   Respiratory: Negative.   Cardiovascular: Negative.   Gastrointestinal: Negative.   Genitourinary: Negative.   Musculoskeletal: Negative.   Skin: Negative.   Neurological: Negative.   Psychiatric/Behavioral: Negative.        Objective:   Physical Exam Constitutional:      General: He is not in acute distress.    Appearance: He is well-developed. He is not diaphoretic.  HENT:     Head: Normocephalic and atraumatic.     Right Ear: External ear normal.     Left Ear: External ear normal.     Nose: Nose normal.     Mouth/Throat:     Pharynx: No oropharyngeal exudate.  Eyes:     General: No scleral icterus.       Right eye: No discharge.        Left eye: No discharge.     Conjunctiva/sclera: Conjunctivae normal.     Pupils: Pupils are equal, round, and reactive to light.  Neck:     Musculoskeletal: Neck supple.     Thyroid: No thyromegaly.     Vascular: No JVD.     Trachea: No tracheal deviation.  Cardiovascular:     Rate and Rhythm: Normal rate and regular rhythm.     Heart sounds: Normal heart sounds. No murmur. No friction rub. No gallop.   Pulmonary:     Effort: Pulmonary effort is normal. No respiratory distress.     Breath sounds: Normal breath sounds. No wheezing or rales.  Chest:     Chest wall: No tenderness.  Abdominal:     General: Bowel sounds are normal. There is no distension.     Palpations: Abdomen is soft. There is no mass.     Tenderness: There is no abdominal tenderness. There is no guarding or rebound.  Genitourinary:    Penis: Normal. No tenderness.      Prostate: Normal.     Rectum: Normal. Guaiac result negative.  Musculoskeletal: Normal range of motion.        General: No tenderness.  Lymphadenopathy:   Cervical: No cervical adenopathy.  Skin:    General: Skin is warm and dry.     Coloration: Skin is not pale.     Findings: No erythema or rash.  Neurological:     Mental Status: He is alert and oriented to person, place, and time.     Cranial Nerves: No cranial nerve deficit.     Motor: No abnormal muscle tone.     Coordination: Coordination normal.     Deep Tendon Reflexes: Reflexes are normal and symmetric. Reflexes normal.  Psychiatric:        Behavior: Behavior normal.        Thought Content: Thought content normal.        Judgment: Judgment normal.           Assessment & Plan:  Well exam. We discussed diet and exercise. Get fasting labs.  Gershon Crane, MD

## 2018-05-04 ENCOUNTER — Encounter: Payer: Self-pay | Admitting: Family Medicine

## 2019-04-16 ENCOUNTER — Encounter: Payer: No Typology Code available for payment source | Admitting: Family Medicine

## 2019-05-15 ENCOUNTER — Encounter: Payer: No Typology Code available for payment source | Admitting: Family Medicine

## 2019-05-25 ENCOUNTER — Encounter: Payer: No Typology Code available for payment source | Admitting: Family Medicine

## 2019-06-21 ENCOUNTER — Other Ambulatory Visit: Payer: Self-pay

## 2019-06-22 ENCOUNTER — Ambulatory Visit (INDEPENDENT_AMBULATORY_CARE_PROVIDER_SITE_OTHER): Payer: No Typology Code available for payment source | Admitting: Family Medicine

## 2019-06-22 ENCOUNTER — Encounter: Payer: Self-pay | Admitting: Family Medicine

## 2019-06-22 ENCOUNTER — Ambulatory Visit (INDEPENDENT_AMBULATORY_CARE_PROVIDER_SITE_OTHER)
Admission: RE | Admit: 2019-06-22 | Discharge: 2019-06-22 | Disposition: A | Discharge status: Home / Self Care | Payer: No Typology Code available for payment source | Source: Ambulatory Visit | Attending: Family Medicine | Admitting: Family Medicine

## 2019-06-22 VITALS — BP 118/62 | HR 67 | Temp 98.6°F | Wt 149.6 lb

## 2019-06-22 DIAGNOSIS — M25811 Other specified joint disorders, right shoulder: Secondary | ICD-10-CM | POA: Diagnosis not present

## 2019-06-22 DIAGNOSIS — Z0000 Encounter for general adult medical examination without abnormal findings: Secondary | ICD-10-CM | POA: Diagnosis not present

## 2019-06-22 DIAGNOSIS — R222 Localized swelling, mass and lump, trunk: Secondary | ICD-10-CM | POA: Diagnosis not present

## 2019-06-22 LAB — CBC WITH DIFFERENTIAL/PLATELET
Basophils Absolute: 0 10*3/uL (ref 0.0–0.1)
Basophils Relative: 0.7 % (ref 0.0–3.0)
Eosinophils Absolute: 0.3 10*3/uL (ref 0.0–0.7)
Eosinophils Relative: 4 % (ref 0.0–5.0)
HCT: 44.7 % (ref 39.0–52.0)
Hemoglobin: 15.7 g/dL (ref 13.0–17.0)
Lymphocytes Relative: 18.5 % (ref 12.0–46.0)
Lymphs Abs: 1.3 10*3/uL (ref 0.7–4.0)
MCHC: 35 g/dL (ref 30.0–36.0)
MCV: 96 fl (ref 78.0–100.0)
Monocytes Absolute: 0.5 10*3/uL (ref 0.1–1.0)
Monocytes Relative: 6.7 % (ref 3.0–12.0)
Neutro Abs: 4.8 10*3/uL (ref 1.4–7.7)
Neutrophils Relative %: 70.1 % (ref 43.0–77.0)
Platelets: 278 10*3/uL (ref 150.0–400.0)
RBC: 4.66 Mil/uL (ref 4.22–5.81)
RDW: 13.4 % (ref 11.5–15.5)
WBC: 6.8 10*3/uL (ref 4.0–10.5)

## 2019-06-22 LAB — LIPID PANEL
Cholesterol: 212 mg/dL — ABNORMAL HIGH (ref 0–200)
HDL: 69.2 mg/dL (ref >39.00)
LDL Cholesterol: 132 mg/dL — ABNORMAL HIGH (ref 0–99)
NonHDL: 143.24
Total CHOL/HDL Ratio: 3
Triglycerides: 57 mg/dL (ref 0.0–149.0)
VLDL: 11.4 mg/dL (ref 0.0–40.0)

## 2019-06-22 LAB — BASIC METABOLIC PANEL
BUN: 19 mg/dL (ref 6–23)
CO2: 29 mEq/L (ref 19–32)
Calcium: 9.7 mg/dL (ref 8.4–10.5)
Chloride: 102 mEq/L (ref 96–112)
Creatinine, Ser: 1.15 mg/dL (ref 0.40–1.50)
GFR: 79.65 mL/min (ref >60.00)
Glucose, Bld: 94 mg/dL (ref 70–99)
Potassium: 4.2 mEq/L (ref 3.5–5.1)
Sodium: 137 mEq/L (ref 135–145)

## 2019-06-22 LAB — TSH: TSH: 1.48 u[IU]/mL (ref 0.35–4.50)

## 2019-06-22 LAB — HEPATIC FUNCTION PANEL
ALT: 12 U/L (ref 0–53)
AST: 23 U/L (ref 0–37)
Albumin: 4.7 g/dL (ref 3.5–5.2)
Alkaline Phosphatase: 49 U/L (ref 39–117)
Bilirubin, Direct: 0.2 mg/dL (ref 0.0–0.3)
Total Bilirubin: 0.9 mg/dL (ref 0.2–1.2)
Total Protein: 7 g/dL (ref 6.0–8.3)

## 2019-06-22 LAB — URINALYSIS
Bilirubin Urine: NEGATIVE
Hgb urine dipstick: NEGATIVE
Ketones, ur: NEGATIVE
Leukocytes,Ua: NEGATIVE
Nitrite: NEGATIVE
Specific Gravity, Urine: <=1.005 — AB (ref 1.000–1.030)
Total Protein, Urine: NEGATIVE
Urine Glucose: NEGATIVE
Urobilinogen, UA: 0.2 (ref 0.0–1.0)
pH: 6 (ref 5.0–8.0)

## 2019-06-22 LAB — PSA: PSA: 3.36 ng/mL (ref 0.10–4.00)

## 2019-06-22 NOTE — Progress Notes (Signed)
Subjective:    Patient ID: Eric Weeks, male    DOB: May 04, 1965, 54 y.o.   MRN: [id number]  HPI Here for a well exam. He feels great. He asks me to check 2 lumps that appeared between one and two years ago. One is at the right shoulder and one is on the chest. They do nto bother him at all and they have not changed at all.    Review of Systems  Constitutional: Negative.   HENT: Negative.   Eyes: Negative.   Respiratory: Negative.   Cardiovascular: Negative.   Gastrointestinal: Negative.   Genitourinary: Negative.   Musculoskeletal: Negative.   Skin: Negative.   Neurological: Negative.   Psychiatric/Behavioral: Negative.        Objective:   Physical Exam Constitutional:      General: He is not in acute distress.    Appearance: He is well-developed. He is not diaphoretic.  HENT:     Head: Normocephalic and atraumatic.     Right Ear: External ear normal.     Left Ear: External ear normal.     Nose: Nose normal.     Mouth/Throat:     Pharynx: No oropharyngeal exudate.  Eyes:     General: No scleral icterus.       Right eye: No discharge.        Left eye: No discharge.     Conjunctiva/sclera: Conjunctivae normal.     Pupils: Pupils are equal, round, and reactive to light.  Neck:     Thyroid: No thyromegaly.     Vascular: No JVD.     Trachea: No tracheal deviation.  Cardiovascular:     Rate and Rhythm: Normal rate and regular rhythm.     Heart sounds: Normal heart sounds. No murmur heard.  No friction rub. No gallop.   Pulmonary:     Effort: Pulmonary effort is normal. No respiratory distress.     Breath sounds: Normal breath sounds. No wheezing or rales.  Chest:     Chest wall: No tenderness.  Abdominal:     General: Bowel sounds are normal. There is no distension.     Palpations: Abdomen is soft. There is no mass.     Tenderness: There is no abdominal tenderness. There is no guarding or rebound.  Genitourinary:    Penis: Normal. No tenderness.       Testes: Normal.     Prostate: Normal.     Rectum: Normal. Guaiac result negative.  Musculoskeletal:        General: No tenderness. Normal range of motion.     Cervical back: Neck supple.     Comments: There is a small firm bony lump at the distal tip of the right clavicle and a similar lump at the right side of the manubriosternal joint. Neither one is tender   Lymphadenopathy:     Cervical: No cervical adenopathy.  Skin:    General: Skin is warm and dry.     Coloration: Skin is not pale.     Findings: No erythema or rash.  Neurological:     Mental Status: He is alert and oriented to person, place, and time.     Cranial Nerves: No cranial nerve deficit.     Motor: No abnormal muscle tone.     Coordination: Coordination normal.     Deep Tendon Reflexes: Reflexes are normal and symmetric. Reflexes normal.  Psychiatric:        Behavior: Behavior normal.  Thought Content: Thought content normal.        Judgment: Judgment normal.           Assessment & Plan:  Well exam. We discussed diet and exercise. Get fasting labs. The lumps above appear to be benign bony cysts, but to be sure we will get Xrays of both. Gershon Crane, MD

## 2020-05-28 ENCOUNTER — Encounter: Payer: No Typology Code available for payment source | Admitting: Family Medicine

## 2020-06-26 ENCOUNTER — Encounter: Payer: No Typology Code available for payment source | Admitting: Family Medicine

## 2020-06-27 ENCOUNTER — Encounter: Payer: Self-pay | Admitting: Family Medicine

## 2020-06-27 ENCOUNTER — Other Ambulatory Visit: Payer: Self-pay

## 2020-06-27 ENCOUNTER — Encounter: Payer: No Typology Code available for payment source | Admitting: Family Medicine

## 2020-06-27 ENCOUNTER — Ambulatory Visit (INDEPENDENT_AMBULATORY_CARE_PROVIDER_SITE_OTHER): Payer: No Typology Code available for payment source | Admitting: Family Medicine

## 2020-06-27 VITALS — BP 128/78 | HR 73 | Temp 98.1°F | Ht 65.5 in | Wt 146.0 lb

## 2020-06-27 DIAGNOSIS — E0590 Thyrotoxicosis, unspecified without thyrotoxic crisis or storm: Secondary | ICD-10-CM

## 2020-06-27 DIAGNOSIS — Z0000 Encounter for general adult medical examination without abnormal findings: Secondary | ICD-10-CM

## 2020-06-27 LAB — CBC WITH DIFFERENTIAL/PLATELET
Basophils Absolute: 0 10*3/uL (ref 0.0–0.1)
Basophils Relative: 0.8 % (ref 0.0–3.0)
Eosinophils Absolute: 0.1 10*3/uL (ref 0.0–0.7)
Eosinophils Relative: 2.9 % (ref 0.0–5.0)
HCT: 40.4 % (ref 39.0–52.0)
Hemoglobin: 14.4 g/dL (ref 13.0–17.0)
Lymphocytes Relative: 22.3 % (ref 12.0–46.0)
Lymphs Abs: 1 10*3/uL (ref 0.7–4.0)
MCHC: 35.6 g/dL (ref 30.0–36.0)
MCV: 93.7 fl (ref 78.0–100.0)
Monocytes Absolute: 0.3 10*3/uL (ref 0.1–1.0)
Monocytes Relative: 6.7 % (ref 3.0–12.0)
Neutro Abs: 2.9 10*3/uL (ref 1.4–7.7)
Neutrophils Relative %: 67.3 % (ref 43.0–77.0)
Platelets: 217 10*3/uL (ref 150.0–400.0)
RBC: 4.31 Mil/uL (ref 4.22–5.81)
RDW: 12.9 % (ref 11.5–15.5)
WBC: 4.3 10*3/uL (ref 4.0–10.5)

## 2020-06-27 LAB — BASIC METABOLIC PANEL
BUN: 19 mg/dL (ref 6–23)
CO2: 27 mEq/L (ref 19–32)
Calcium: 9.4 mg/dL (ref 8.4–10.5)
Chloride: 105 mEq/L (ref 96–112)
Creatinine, Ser: 1.16 mg/dL (ref 0.40–1.50)
GFR: 70.24 mL/min (ref >60.00)
Glucose, Bld: 93 mg/dL (ref 70–99)
Potassium: 4 mEq/L (ref 3.5–5.1)
Sodium: 139 mEq/L (ref 135–145)

## 2020-06-27 LAB — PSA: PSA: 2.39 ng/mL (ref 0.10–4.00)

## 2020-06-27 LAB — TSH: TSH: 1.38 u[IU]/mL (ref 0.35–4.50)

## 2020-06-27 LAB — LIPID PANEL
Cholesterol: 182 mg/dL (ref 0–200)
HDL: 67.2 mg/dL (ref >39.00)
LDL Cholesterol: 103 mg/dL — ABNORMAL HIGH (ref 0–99)
NonHDL: 114.68
Total CHOL/HDL Ratio: 3
Triglycerides: 59 mg/dL (ref 0.0–149.0)
VLDL: 11.8 mg/dL (ref 0.0–40.0)

## 2020-06-27 LAB — URINALYSIS
Bilirubin Urine: NEGATIVE
Hgb urine dipstick: NEGATIVE
Ketones, ur: NEGATIVE
Leukocytes,Ua: NEGATIVE
Nitrite: NEGATIVE
Specific Gravity, Urine: >=1.03 — AB (ref 1.000–1.030)
Total Protein, Urine: NEGATIVE
Urine Glucose: NEGATIVE
Urobilinogen, UA: 0.2 (ref 0.0–1.0)
pH: 5.5 (ref 5.0–8.0)

## 2020-06-27 LAB — T4, FREE: Free T4: 1.68 ng/dL — ABNORMAL HIGH (ref 0.60–1.60)

## 2020-06-27 LAB — HEPATIC FUNCTION PANEL
ALT: 9 U/L (ref 0–53)
AST: 19 U/L (ref 0–37)
Albumin: 4.3 g/dL (ref 3.5–5.2)
Alkaline Phosphatase: 39 U/L (ref 39–117)
Bilirubin, Direct: 0.2 mg/dL (ref 0.0–0.3)
Total Bilirubin: 1.1 mg/dL (ref 0.2–1.2)
Total Protein: 6.4 g/dL (ref 6.0–8.3)

## 2020-06-27 LAB — T3, FREE: T3, Free: 7 pg/mL — ABNORMAL HIGH (ref 2.3–4.2)

## 2020-06-27 LAB — HEMOGLOBIN A1C: Hgb A1c MFr Bld: 5 % (ref 4.6–6.5)

## 2020-06-27 NOTE — Progress Notes (Signed)
   Subjective:    Patient ID: Eric Weeks, male    DOB: 1965/04/12, 55 y.o.   MRN: [id number]  HPI Here for a well exam. He feels fine.    Review of Systems  Constitutional: Negative.   HENT: Negative.    Eyes: Negative.   Respiratory: Negative.    Cardiovascular: Negative.   Gastrointestinal: Negative.   Genitourinary: Negative.   Musculoskeletal: Negative.   Skin: Negative.   Neurological: Negative.   Psychiatric/Behavioral: Negative.        Objective:   Physical Exam Constitutional:      General: He is not in acute distress.    Appearance: Normal appearance. He is well-developed. He is not diaphoretic.  HENT:     Head: Normocephalic and atraumatic.     Right Ear: External ear normal.     Left Ear: External ear normal.     Nose: Nose normal.     Mouth/Throat:     Pharynx: No oropharyngeal exudate.  Eyes:     General: No scleral icterus.       Right eye: No discharge.        Left eye: No discharge.     Conjunctiva/sclera: Conjunctivae normal.     Pupils: Pupils are equal, round, and reactive to light.  Neck:     Thyroid: No thyromegaly.     Vascular: No JVD.     Trachea: No tracheal deviation.  Cardiovascular:     Rate and Rhythm: Normal rate and regular rhythm.     Heart sounds: Normal heart sounds. No murmur heard.   No friction rub. No gallop.  Pulmonary:     Effort: Pulmonary effort is normal. No respiratory distress.     Breath sounds: Normal breath sounds. No wheezing or rales.  Chest:     Chest wall: No tenderness.  Abdominal:     General: Bowel sounds are normal. There is no distension.     Palpations: Abdomen is soft. There is no mass.     Tenderness: There is no abdominal tenderness. There is no guarding or rebound.  Genitourinary:    Penis: Normal. No tenderness.      Testes: Normal.     Prostate: Normal.     Rectum: Normal. Guaiac result negative.  Musculoskeletal:        General: No tenderness. Normal range of motion.     Cervical  back: Neck supple.  Lymphadenopathy:     Cervical: No cervical adenopathy.  Skin:    General: Skin is warm and dry.     Coloration: Skin is not pale.     Findings: No erythema or rash.  Neurological:     Mental Status: He is alert and oriented to person, place, and time.     Cranial Nerves: No cranial nerve deficit.     Motor: No abnormal muscle tone.     Coordination: Coordination normal.     Deep Tendon Reflexes: Reflexes are normal and symmetric. Reflexes normal.  Psychiatric:        Behavior: Behavior normal.        Thought Content: Thought content normal.        Judgment: Judgment normal.          Assessment & Plan:  Well exam. We discussed diet and exercise. Get fasting labs. Gershon Crane, MD

## 2020-06-27 NOTE — Addendum Note (Signed)
Addended by: Kandra Nicolas on: 06/27/2020 11:03 AM   Modules accepted: Orders

## 2020-06-30 NOTE — Addendum Note (Signed)
Addended by: Gershon Crane A on: 06/30/2020 08:02 AM   Modules accepted: Orders

## 2020-07-10 ENCOUNTER — Encounter: Payer: Self-pay | Admitting: Endocrinology

## 2020-08-20 ENCOUNTER — Ambulatory Visit: Payer: No Typology Code available for payment source | Admitting: Endocrinology

## 2020-08-22 ENCOUNTER — Ambulatory Visit (INDEPENDENT_AMBULATORY_CARE_PROVIDER_SITE_OTHER): Payer: No Typology Code available for payment source | Admitting: Endocrinology

## 2020-08-22 ENCOUNTER — Encounter: Payer: Self-pay | Admitting: Endocrinology

## 2020-08-22 ENCOUNTER — Other Ambulatory Visit: Payer: Self-pay

## 2020-08-22 DIAGNOSIS — R946 Abnormal results of thyroid function studies: Secondary | ICD-10-CM

## 2020-08-22 LAB — T3, FREE: T3, Free: 2.9 pg/mL (ref 2.3–4.2)

## 2020-08-22 LAB — T4, FREE: Free T4: 0.96 ng/dL (ref 0.60–1.60)

## 2020-08-22 LAB — TSH: TSH: 1.81 u[IU]/mL (ref 0.35–5.50)

## 2020-08-22 NOTE — Progress Notes (Signed)
Subjective:    Patient ID: Eric Weeks, male    DOB: [birth year], 55 y.o.   MRN: [id number]  HPI Pt is referred by Dr Clent Ridges, for abnormal TFT.  Pt was found to have abnormal TFT in 2022 in.  He has never been on thyroid medication.  He has never had XRT to the anterior neck, or thyroid surgery.  He has never had thyroid imaging.  He does not consume kelp or any other non-prescribed thyroid medication.  He has never been on amiodarone.  He last took biotin 2 mos ago.   Past Medical History:  Diagnosis Date   Epiretinal membrane    sees [facility] on [employer]    Hyperlipidemia     Past Surgical History:  Procedure Laterality Date   CIRCUMCISION     COLONOSCOPY  07/03/2015   per Dr. Marina Goodell, internal hemorrhoids, no polyps, repeat in 10 yrs    ESOPHAGOGASTRODUODENOSCOPY  07/03/2015   per Dr. Marina Goodell, mild esophagitis    VASECTOMY  03-07-09   per Dr. Su Grand    Social History   Socioeconomic History   Marital status: Married    Spouse name: Cala Bradford   Number of children: 2   Years of education: Not on file   Highest education level: Not on file  Occupational History   Occupation: Photographer  Tobacco Use   Smoking status: Never   Smokeless tobacco: Never  Substance and Sexual Activity   Alcohol use: No    Alcohol/week: 0.0 standard drinks   Drug use: No   Sexual activity: Not on file  Other Topics Concern   Not on file  Social History Narrative   Not on file   Social Determinants of Health   Financial Resource Strain: Not on file  Food Insecurity: Not on file  Transportation Needs: Not on file  Physical Activity: Not on file  Stress: Not on file  Social Connections: Not on file  Intimate Partner Violence: Not on file    No current outpatient medications on file prior to visit.   No current facility-administered medications on file prior to visit.    No Known Allergies  Family History  Problem Relation Age of Onset   Breast cancer Maternal Aunt     Colon cancer Neg Hx    Thyroid disease Neg Hx     BP 140/80 (BP Location: Right Arm, Patient Position: Sitting, Cuff Size: Normal)   Pulse 73   Ht 5\' 6"  (1.676 m)   Wt 147 lb 6.4 oz (66.9 kg)   SpO2 98%   BMI 23.79 kg/m    Review of Systems denies weight loss, palpitations, sob, excessive diaphoresis, tremor, anxiety, and heat intolerance.      Objective:   Physical Exam VS: see vs page GEN: no distress HEAD: head: no deformity eyes: no periorbital swelling, no proptosis external nose and ears are normal NECK: supple, thyroid is not enlarged CHEST WALL: no deformity LUNGS: clear to auscultation CV: reg rate and rhythm, no murmur.  MUSCULOSKELETAL: gait is normal and steady EXTEMITIES: no deformity.  no leg edema NEURO:  readily moves all 4's.  sensation is intact to touch on all 4's.  No tremor. SKIN:  Normal texture and temperature.  No rash or suspicious lesion is visible.  Not diaphoretic NODES:  None palpable at the neck PSYCH: alert, well-oriented.  Does not appear anxious nor depressed.   Lab Results  Component Value Date   TSH 1.38 06/27/2020  Lab Results  Component Value Date   TSH 1.81 08/22/2020    I have reviewed outside records, and summarized: Pt was noted to have free T4, and referred here.  He was seen for wellness visit, and overall health was good.     Assessment & Plan:  Spurious elev of T4, due to biotin.  I told pt no thyroid medication is needed.   Patient Instructions  Blood tests are requested for you today.  We'll let you know about the results.

## 2020-08-22 NOTE — Patient Instructions (Signed)
Blood tests are requested for you today.  We'll let you know about the results.  

## 2020-11-16 IMAGING — DX DG SHOULDER 2+V*R*
3 series · 3 of 3 positions shown · non-contrast
Comparison: None.

CLINICAL DATA: Bony lump at the distal clavicle.  No known injury.

EXAM:
RIGHT SHOULDER - 2+ VIEW

[grashey]
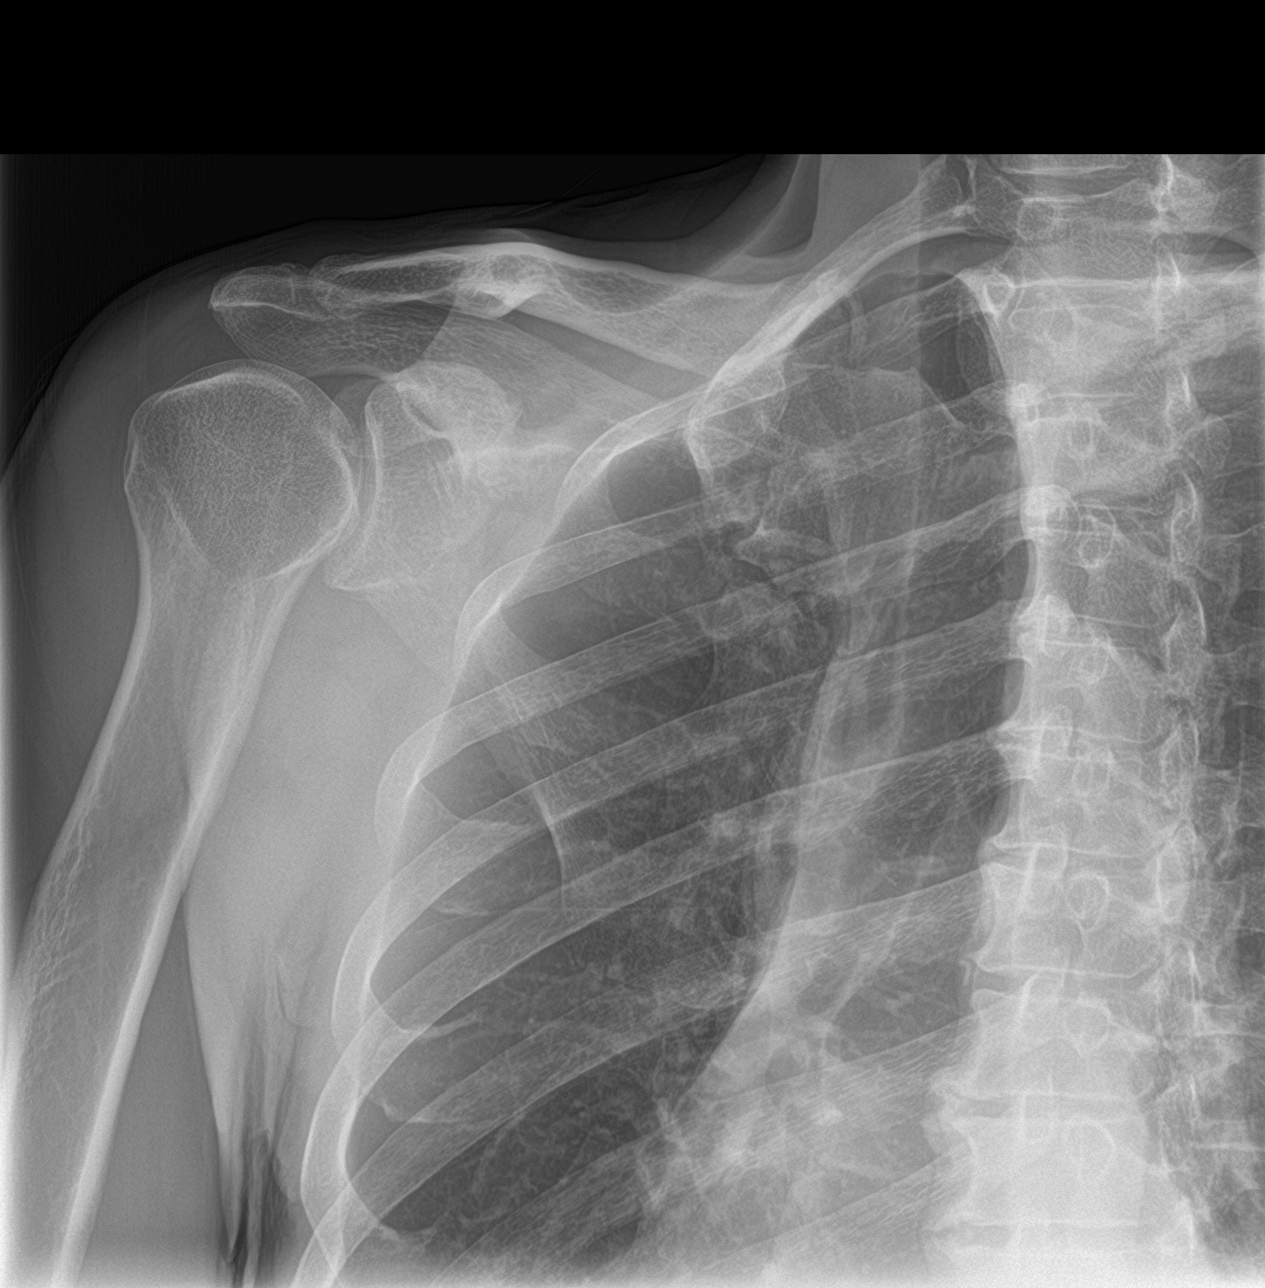

[y view]
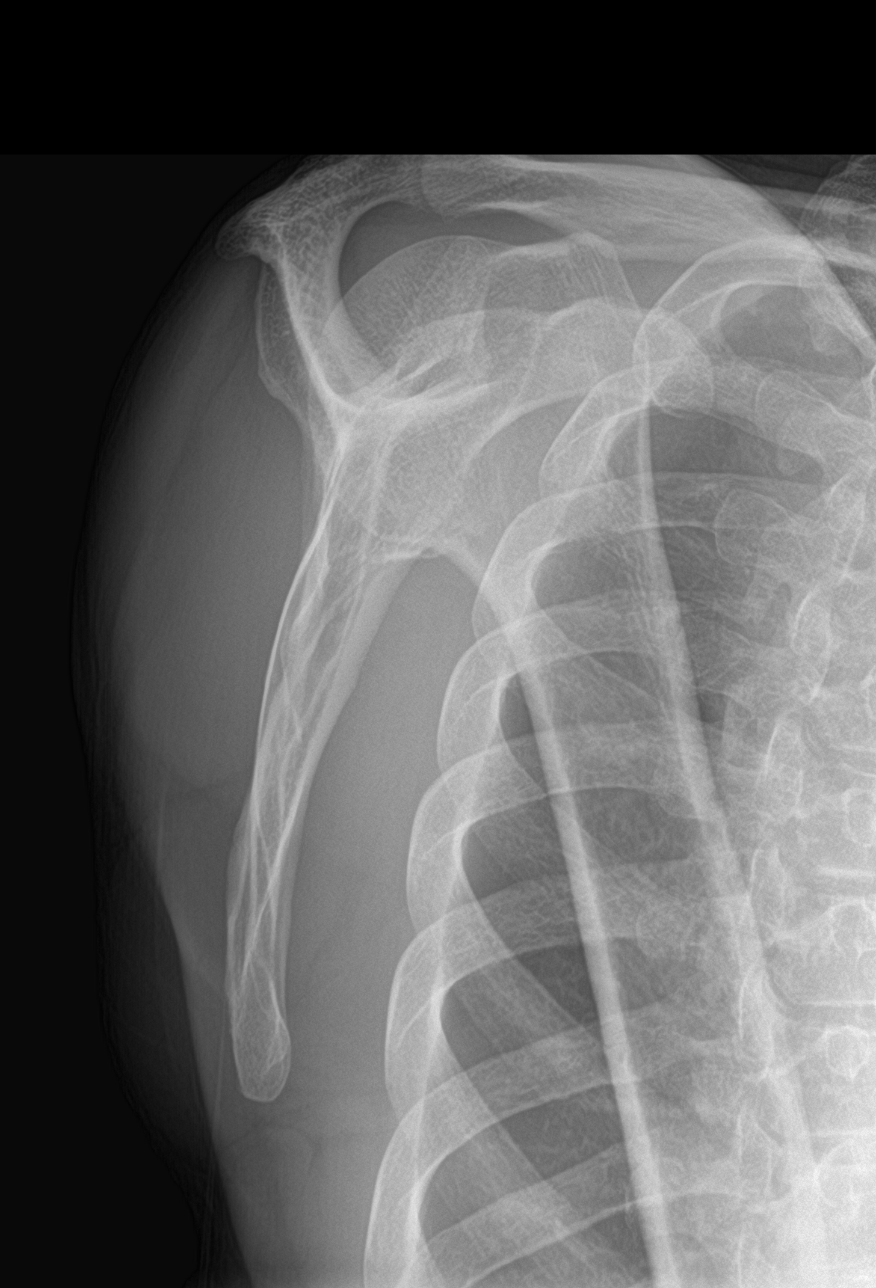

[shoulder axial]
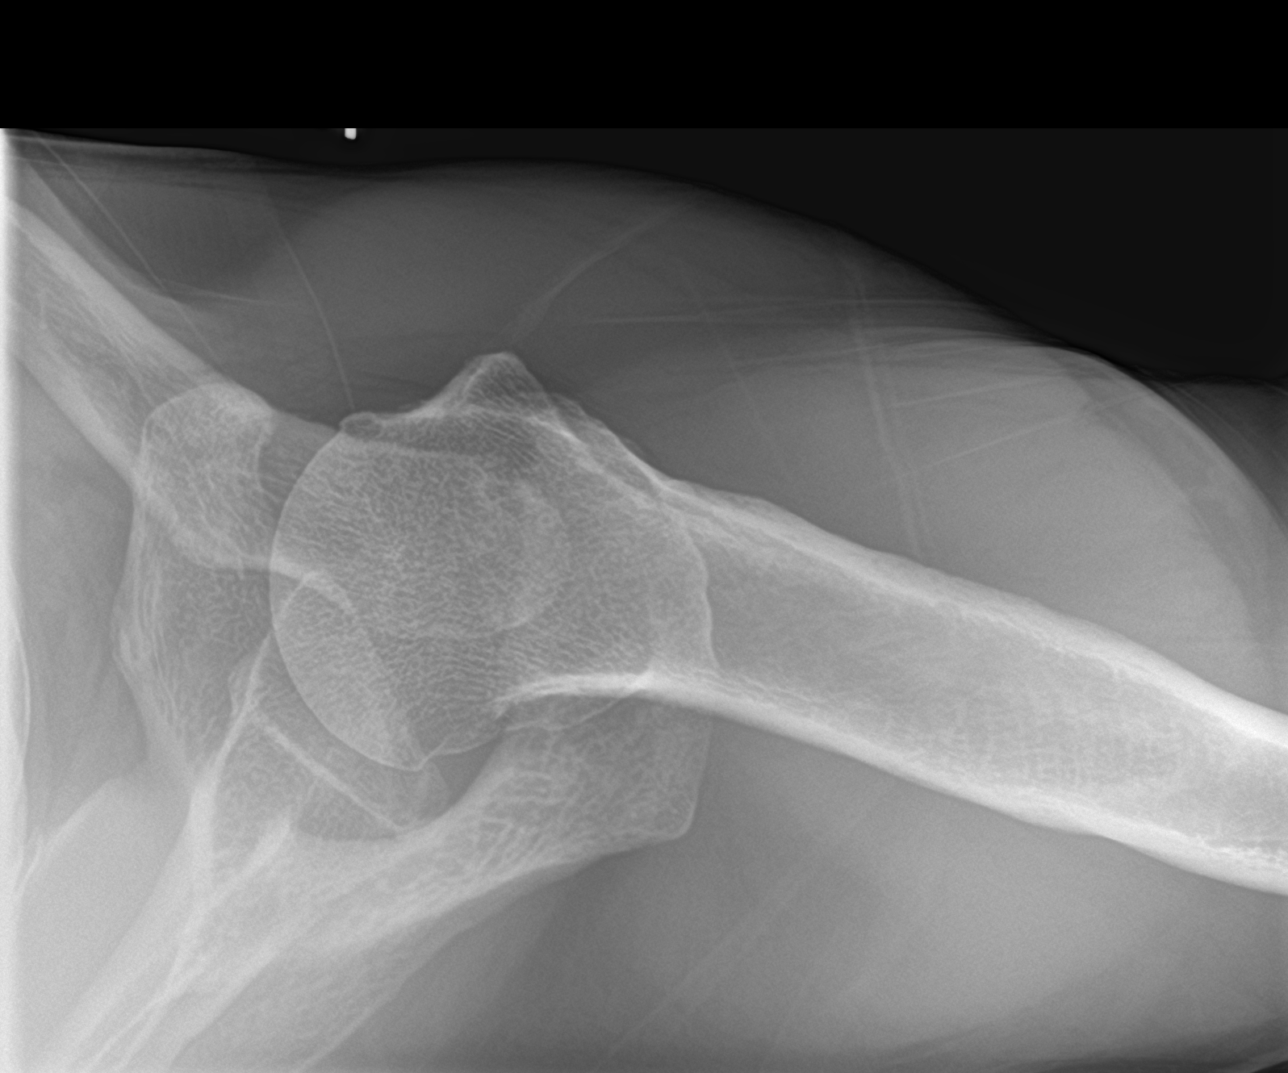

[3 of 3 positions shown; findings below may reference images not displayed]

FINDINGS: There is no evidence of fracture or dislocation. There is no
evidence of arthropathy or other focal bone abnormality. Soft
tissues are unremarkable.
IMPRESSION: No cause for the patient's symptoms identified in the region of the
distal clavicle. No bony excrescence identified. No significant
degenerative changes at the AC joint. No fracture, dislocation, or
other abnormality noted in the right shoulder.

## 2020-11-16 IMAGING — DX DG STERNUM 2+V
2 series · 2 of 2 positions shown · non-contrast
Comparison: None.

CLINICAL DATA: Bony lump at right sternomanubrial joint.

EXAM:
STERNUM - 2+ VIEW

[chest pa]
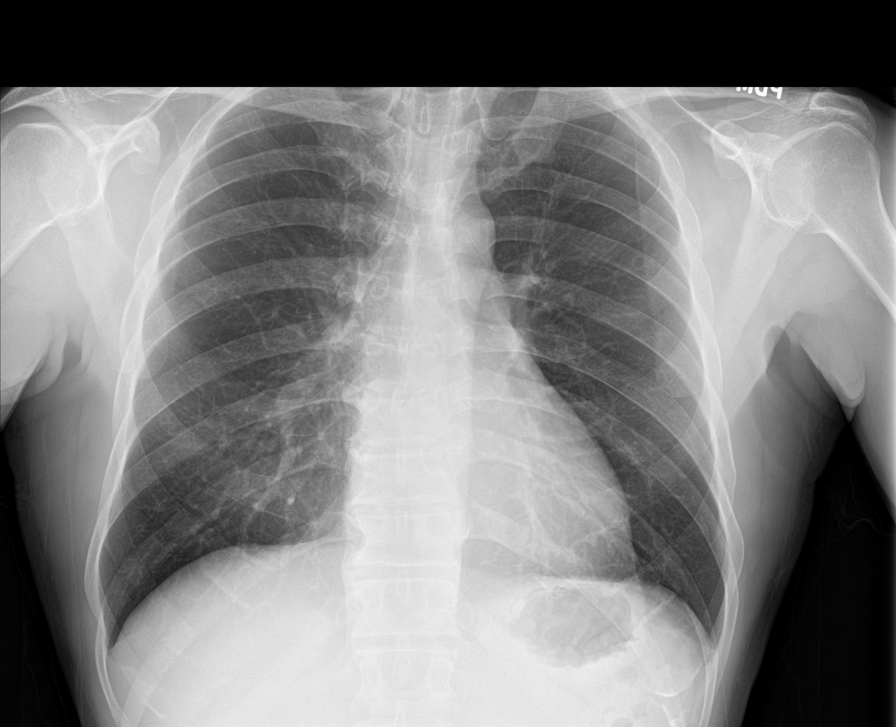

[sternum lat]
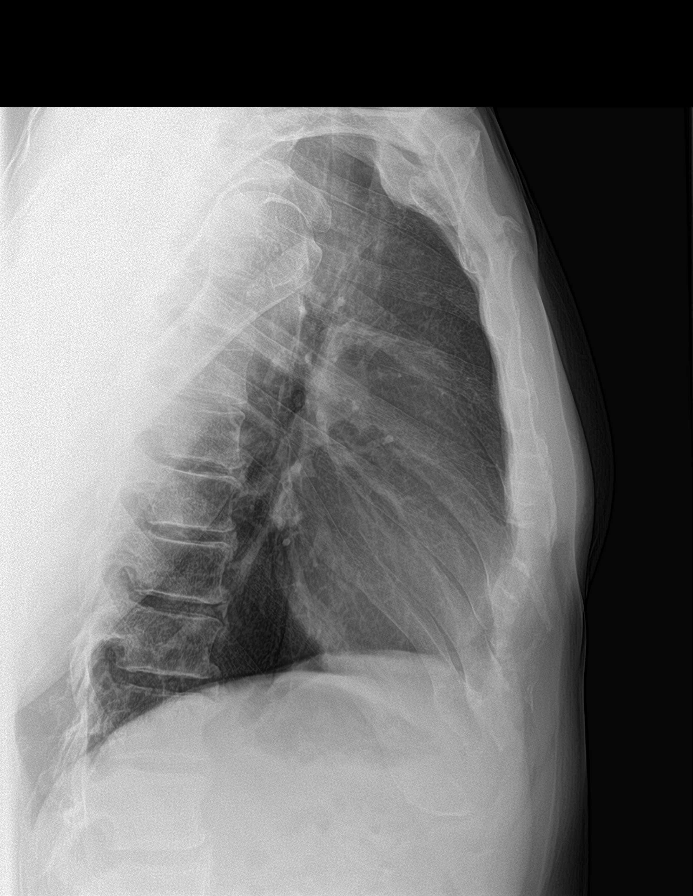

[2 of 2 positions shown; findings below may reference images not displayed]

FINDINGS: There is a bony excrescence associated with the sternomanubrial
joint seen on the lateral view. This is probably degenerative in
nature. The sternum is otherwise unremarkable. The heart, hila,
mediastinum, lungs, and pleura are normal.
IMPRESSION: Bony excrescence at the sternomanubrial joint consistent with
history, probably degenerative.

## 2020-12-19 ENCOUNTER — Ambulatory Visit: Payer: No Typology Code available for payment source | Admitting: Family Medicine

## 2021-07-06 ENCOUNTER — Encounter: Payer: Self-pay | Admitting: Family Medicine

## 2021-07-06 ENCOUNTER — Ambulatory Visit (INDEPENDENT_AMBULATORY_CARE_PROVIDER_SITE_OTHER): Payer: No Typology Code available for payment source | Admitting: Family Medicine

## 2021-07-06 VITALS — BP 102/76 | HR 66 | Temp 98.7°F | Ht 66.0 in | Wt 146.0 lb

## 2021-07-06 DIAGNOSIS — R946 Abnormal results of thyroid function studies: Secondary | ICD-10-CM | POA: Diagnosis not present

## 2021-07-06 DIAGNOSIS — Z23 Encounter for immunization: Secondary | ICD-10-CM | POA: Diagnosis not present

## 2021-07-06 DIAGNOSIS — Z0000 Encounter for general adult medical examination without abnormal findings: Secondary | ICD-10-CM | POA: Diagnosis not present

## 2021-07-06 LAB — BASIC METABOLIC PANEL
BUN: 17 mg/dL (ref 6–23)
CO2: 25 mEq/L (ref 19–32)
Calcium: 8.9 mg/dL (ref 8.4–10.5)
Chloride: 104 mEq/L (ref 96–112)
Creatinine, Ser: 1.04 mg/dL (ref 0.40–1.50)
GFR: 79.5 mL/min (ref >60.00)
Glucose, Bld: 91 mg/dL (ref 70–99)
Potassium: 4 mEq/L (ref 3.5–5.1)
Sodium: 138 mEq/L (ref 135–145)

## 2021-07-06 LAB — CBC WITH DIFFERENTIAL/PLATELET
Basophils Absolute: 0 10*3/uL (ref 0.0–0.1)
Basophils Relative: 0.5 % (ref 0.0–3.0)
Eosinophils Absolute: 0.1 10*3/uL (ref 0.0–0.7)
Eosinophils Relative: 3.6 % (ref 0.0–5.0)
HCT: 41.8 % (ref 39.0–52.0)
Hemoglobin: 14.5 g/dL (ref 13.0–17.0)
Lymphocytes Relative: 23.1 % (ref 12.0–46.0)
Lymphs Abs: 0.9 10*3/uL (ref 0.7–4.0)
MCHC: 34.6 g/dL (ref 30.0–36.0)
MCV: 96.4 fl (ref 78.0–100.0)
Monocytes Absolute: 0.3 10*3/uL (ref 0.1–1.0)
Monocytes Relative: 7.1 % (ref 3.0–12.0)
Neutro Abs: 2.7 10*3/uL (ref 1.4–7.7)
Neutrophils Relative %: 65.7 % (ref 43.0–77.0)
Platelets: 185 10*3/uL (ref 150.0–400.0)
RBC: 4.34 Mil/uL (ref 4.22–5.81)
RDW: 13.4 % (ref 11.5–15.5)
WBC: 4.1 10*3/uL (ref 4.0–10.5)

## 2021-07-06 LAB — T4, FREE: Free T4: 1.03 ng/dL (ref 0.60–1.60)

## 2021-07-06 LAB — HEPATIC FUNCTION PANEL
ALT: 9 U/L (ref 0–53)
AST: 18 U/L (ref 0–37)
Albumin: 4 g/dL (ref 3.5–5.2)
Alkaline Phosphatase: 42 U/L (ref 39–117)
Bilirubin, Direct: 0.2 mg/dL (ref 0.0–0.3)
Total Bilirubin: 1 mg/dL (ref 0.2–1.2)
Total Protein: 6.1 g/dL (ref 6.0–8.3)

## 2021-07-06 LAB — LIPID PANEL
Cholesterol: 176 mg/dL (ref 0–200)
HDL: 63.3 mg/dL (ref >39.00)
LDL Cholesterol: 100 mg/dL — ABNORMAL HIGH (ref 0–99)
NonHDL: 112.78
Total CHOL/HDL Ratio: 3
Triglycerides: 62 mg/dL (ref 0.0–149.0)
VLDL: 12.4 mg/dL (ref 0.0–40.0)

## 2021-07-06 LAB — T3, FREE: T3, Free: 2.8 pg/mL (ref 2.3–4.2)

## 2021-07-06 LAB — HEMOGLOBIN A1C: Hgb A1c MFr Bld: 4.9 % (ref 4.6–6.5)

## 2021-07-06 LAB — TSH: TSH: 1.82 u[IU]/mL (ref 0.35–5.50)

## 2021-07-06 LAB — PSA: PSA: 2.87 ng/mL (ref 0.10–4.00)

## 2021-07-06 NOTE — Addendum Note (Signed)
Addended by: Carola Rhine on: 07/06/2021 08:41 AM   Modules accepted: Orders

## 2021-07-06 NOTE — Progress Notes (Signed)
Subjective:    Patient ID: Eric Weeks, male    DOB: 06/04/1965, 56 y.o.   MRN: [id number]  HPI Here for a well exam. He feels well except he mentions an intermittent sharp pain in the right shoulder that started about 3 months ago. No trauma hx. This only bothers him when he sleeps on the right side or when he reaches across his body with the right arm. His wife has already made an appt for him to see an orthopedist in [facility] named Dr. Katrinka Blazing.    Review of Systems  Constitutional: Negative.   HENT: Negative.    Eyes: Negative.   Respiratory: Negative.    Cardiovascular: Negative.   Gastrointestinal: Negative.   Genitourinary: Negative.   Musculoskeletal:  Positive for arthralgias.  Skin: Negative.   Neurological: Negative.   Psychiatric/Behavioral: Negative.         Objective:   Physical Exam Constitutional:      General: He is not in acute distress.    Appearance: Normal appearance. He is well-developed. He is not diaphoretic.  HENT:     Head: Normocephalic and atraumatic.     Right Ear: External ear normal.     Left Ear: External ear normal.     Nose: Nose normal.     Mouth/Throat:     Pharynx: No oropharyngeal exudate.  Eyes:     General: No scleral icterus.       Right eye: No discharge.        Left eye: No discharge.     Conjunctiva/sclera: Conjunctivae normal.     Pupils: Pupils are equal, round, and reactive to light.  Neck:     Thyroid: No thyromegaly.     Vascular: No JVD.     Trachea: No tracheal deviation.  Cardiovascular:     Rate and Rhythm: Normal rate and regular rhythm.     Heart sounds: Normal heart sounds. No murmur heard.    No friction rub. No gallop.  Pulmonary:     Effort: Pulmonary effort is normal. No respiratory distress.     Breath sounds: Normal breath sounds. No wheezing or rales.  Chest:     Chest wall: No tenderness.  Abdominal:     General: Bowel sounds are normal. There is no distension.     Palpations: Abdomen is  soft. There is no mass.     Tenderness: There is no abdominal tenderness. There is no guarding or rebound.  Genitourinary:    Penis: Normal. No tenderness.      Testes: Normal.     Prostate: Normal.     Rectum: Normal. Guaiac result negative.  Musculoskeletal:        General: No tenderness. Normal range of motion.     Cervical back: Neck supple.  Lymphadenopathy:     Cervical: No cervical adenopathy.  Skin:    General: Skin is warm and dry.     Coloration: Skin is not pale.     Findings: No erythema or rash.  Neurological:     Mental Status: He is alert and oriented to person, place, and time.     Cranial Nerves: No cranial nerve deficit.     Motor: No abnormal muscle tone.     Coordination: Coordination normal.     Deep Tendon Reflexes: Reflexes are normal and symmetric. Reflexes normal.  Psychiatric:        Behavior: Behavior normal.        Thought Content: Thought content normal.  Judgment: Judgment normal.           Assessment & Plan:  Well exam. We discussed diet and exercise. Get fasting labs. I suspect he has some inflammation at the right [facility] joint, but he will see Ortho as above.  Gershon Crane, MD

## 2021-07-13 ENCOUNTER — Ambulatory Visit (INDEPENDENT_AMBULATORY_CARE_PROVIDER_SITE_OTHER): Payer: No Typology Code available for payment source | Admitting: Family Medicine

## 2021-07-13 ENCOUNTER — Ambulatory Visit: Payer: Self-pay

## 2021-07-13 VITALS — BP 126/64 | Ht 66.0 in | Wt 145.0 lb

## 2021-07-13 DIAGNOSIS — M25511 Pain in right shoulder: Secondary | ICD-10-CM

## 2021-07-13 DIAGNOSIS — M25411 Effusion, right shoulder: Secondary | ICD-10-CM

## 2021-07-13 MED ORDER — PREDNISONE 5 MG PO TABS: ORAL_TABLET | ORAL | 0 refills | Status: DC

## 2021-07-13 NOTE — Assessment & Plan Note (Signed)
Acute on chronic in nature.  Has an overlying effusion of the [facility] joint but also of the subscapularis. -Counseled on home exercise therapy and supportive care. -Prednisone. -Could consider injection or further imaging.

## 2021-07-13 NOTE — Patient Instructions (Signed)
Nice to meet you  Please try the exercises. You can alternate heat or ice or try whichever one feels better. Please send me a message in MyChart with any questions or updates.  Please see me back in 3 weeks..   --Dr. Jordan Likes

## 2021-07-13 NOTE — Progress Notes (Signed)
  [name] - 56 y.o. male MRN [id number]  Date of birth: Jul 20, 1965  SUBJECTIVE:  Including CC & ROS.  No chief complaint on file.   Eric Weeks is a 56 y.o. male that is presenting with right shoulder pain.  He has normal range of motion but pain is worse when he lies on the affected side.  No specific injury or inciting event.  No history of surgery.   Review of Systems See HPI   HISTORY: Past Medical, Surgical, Social, and Family History Reviewed & Updated per EMR.   Pertinent Historical Findings include:  Past Medical History:  Diagnosis Date   Epiretinal membrane    sees [facility] on [employer]    Hyperlipidemia     Past Surgical History:  Procedure Laterality Date   CIRCUMCISION     COLONOSCOPY  07/03/2015   per Dr. Marina Goodell, internal hemorrhoids, no polyps, repeat in 10 yrs    ESOPHAGOGASTRODUODENOSCOPY  07/03/2015   per Dr. Marina Goodell, mild esophagitis    VASECTOMY  03-07-09   per Dr. Su Grand     PHYSICAL EXAM:  VS: BP 126/64   Ht 5\' 6"  (1.676 m)   Wt 145 lb (65.8 kg)   BMI 23.40 kg/m  Physical Exam Gen: NAD, alert, cooperative with exam, well-appearing MSK:  Neurovascularly intact    Limited ultrasound: Right shoulder:  Normal-appearing biceps tendon. There is overlying effusion of the subscapularis. Mild subacromial bursitis. Moderate effusion and degenerative changes of the [facility] joint. No significant changes in the posterior glenohumeral joint  Summary: AC joint effusion and anterior fusion  Ultrasound and interpretation by [facility], MD    ASSESSMENT & PLAN:   Effusion of acromioclavicular joint, right Acute on chronic in nature.  Has an overlying effusion of the [facility] joint but also of the subscapularis. -Counseled on home exercise therapy and supportive care. -Prednisone. -Could consider injection or further imaging.

## 2021-08-03 ENCOUNTER — Ambulatory Visit: Payer: No Typology Code available for payment source | Admitting: Family Medicine

## 2022-04-05 ENCOUNTER — Encounter: Payer: No Typology Code available for payment source | Admitting: Family Medicine

## 2022-04-19 ENCOUNTER — Encounter: Payer: Self-pay | Admitting: *Deleted

## 2022-07-12 ENCOUNTER — Encounter: Payer: No Typology Code available for payment source | Admitting: Family Medicine

## 2022-07-23 ENCOUNTER — Encounter: Payer: Self-pay | Admitting: Family Medicine

## 2022-07-23 ENCOUNTER — Ambulatory Visit (INDEPENDENT_AMBULATORY_CARE_PROVIDER_SITE_OTHER): Payer: No Typology Code available for payment source | Admitting: Family Medicine

## 2022-07-23 VITALS — BP 130/90 | HR 76 | Temp 98.1°F | Ht 65.5 in | Wt 148.0 lb

## 2022-07-23 DIAGNOSIS — Z0000 Encounter for general adult medical examination without abnormal findings: Secondary | ICD-10-CM

## 2022-07-23 LAB — CBC WITH DIFFERENTIAL/PLATELET
Basophils Absolute: 41 cells/uL (ref 0–200)
Basophils Relative: 0.8 %
MCH: 33.8 pg — ABNORMAL HIGH (ref 27.0–33.0)
MCV: 98.4 fL (ref 80.0–100.0)
MPV: 9.9 fL (ref 7.5–12.5)
Monocytes Relative: 6.3 %
Neutrophils Relative %: 65.4 %
Platelets: 239 10*3/uL (ref 140–400)

## 2022-07-23 MED ORDER — TERBINAFINE HCL 250 MG PO TABS: 250.0000 mg | ORAL_TABLET | Freq: Every day | ORAL | 3 refills | Status: AC

## 2022-07-23 NOTE — Addendum Note (Signed)
Addended by: Marian Sorrow D on: 07/23/2022 08:48 AM   Modules accepted: Orders

## 2022-07-23 NOTE — Progress Notes (Signed)
Subjective:    Patient ID: Eric Weeks, male    DOB: [birth year], 57 y.o.   MRN: [id number]  HPI Here for a well exam. He has 2 concerns. 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 He has also has pain at the tip of his right 4th toe. No hx of trauma.    Review of Systems  Constitutional: Negative.   HENT: Negative.    Eyes: Negative.   Respiratory: Negative.    Cardiovascular: Negative.   Gastrointestinal: Negative.   Genitourinary: Negative.   Musculoskeletal: Negative.   Skin: Negative.   Neurological: Negative.   Psychiatric/Behavioral: Negative.         Objective:   Physical Exam Constitutional:      General: He is not in acute distress.    Appearance: Normal appearance. He is well-developed. He is not diaphoretic.  HENT:     Head: Normocephalic and atraumatic.     Right Ear: External ear normal.     Left Ear: External ear normal.     Nose: Nose normal.     Mouth/Throat:     Pharynx: No oropharyngeal exudate.  Eyes:     General: No scleral icterus.       Right eye: No discharge.        Left eye: No discharge.     Conjunctiva/sclera: Conjunctivae normal.     Pupils: Pupils are equal, round, and reactive to light.  Neck:     Thyroid: No thyromegaly.     Vascular: No JVD.     Trachea: No tracheal deviation.  Cardiovascular:     Rate and Rhythm: Normal rate and regular rhythm.     Heart sounds: Normal heart sounds. No murmur heard.    No friction rub. No gallop.  Pulmonary:     Effort: Pulmonary effort is normal. No respiratory distress.     Breath sounds: Normal breath sounds. No wheezing or rales.  Chest:     Chest wall: No tenderness.  Abdominal:     General: Bowel sounds are normal. There is no distension.     Palpations: Abdomen is soft. There is no mass.     Tenderness: There is no abdominal tenderness. There is no guarding or rebound.  Genitourinary:    Penis: Normal. No  tenderness.      Testes: Normal.     Prostate: Normal.     Rectum: Normal. Guaiac result negative.  Musculoskeletal:        General: No swelling or deformity. Normal range of motion.     Cervical back: Neck supple.     Comments: He is tender on the inferior surface of the right 4th metatarsal head  Lymphadenopathy:     Cervical: No cervical adenopathy.  Skin:    General: Skin is warm and dry.     Coloration: Skin is not pale.     Findings: No erythema or rash.     Comments: All toenails show fungal involvement. The right 4th toenail is thickened and somewhat ingrown   Neurological:     General: No focal deficit present.     Mental Status: He is alert and oriented to person, place, and time.     Cranial Nerves: No cranial nerve deficit.     Motor: No abnormal muscle tone.     Coordination: Coordination normal.     Deep Tendon Reflexes: Reflexes are normal and symmetric.  Reflexes normal.  Psychiatric:        Behavior: Behavior normal.        Thought Content: Thought content normal.        Judgment: Judgment normal.           Assessment & Plan:  Well exam. We discussed diet and exercise. Get fasting labs. He has onychomycosis, and we will treat with Terbinafine daily for 6 months. I offered to refer him to Podiatry to remove the toenail but he declined. To help with the toe pain and the foot pain, I suggested he buy a pair of more comfortable work boots, and he agreed.  Gershon Crane, MD

## 2022-07-24 LAB — HEPATIC FUNCTION PANEL
AG Ratio: 1.7 (calc) (ref 1.0–2.5)
ALT: 13 U/L (ref 9–46)
AST: 19 U/L (ref 10–35)
Albumin: 4.1 g/dL (ref 3.6–5.1)
Alkaline phosphatase (APISO): 55 U/L (ref 35–144)
Bilirubin, Direct: 0.2 mg/dL (ref 0.0–0.2)
Globulin: 2.4 g/dL (calc) (ref 1.9–3.7)
Indirect Bilirubin: 0.7 mg/dL (calc) (ref 0.2–1.2)
Total Bilirubin: 0.9 mg/dL (ref 0.2–1.2)
Total Protein: 6.5 g/dL (ref 6.1–8.1)

## 2022-07-24 LAB — BASIC METABOLIC PANEL
BUN: 17 mg/dL (ref 7–25)
CO2: 29 mmol/L (ref 20–32)
Calcium: 9.4 mg/dL (ref 8.6–10.3)
Chloride: 103 mmol/L (ref 98–110)
Creat: 1.07 mg/dL (ref 0.70–1.30)
Glucose, Bld: 88 mg/dL (ref 65–99)
Potassium: 4.5 mmol/L (ref 3.5–5.3)
Sodium: 140 mmol/L (ref 135–146)

## 2022-07-24 LAB — LIPID PANEL
Cholesterol: 211 mg/dL — ABNORMAL HIGH (ref <200)
HDL: 79 mg/dL (ref >=40)
LDL Cholesterol (Calc): 115 mg/dL (calc) — ABNORMAL HIGH
Non-HDL Cholesterol (Calc): 132 mg/dL (calc) — ABNORMAL HIGH (ref <130)
Total CHOL/HDL Ratio: 2.7 (calc) (ref <5.0)
Triglycerides: 71 mg/dL (ref <150)

## 2022-07-24 LAB — HEMOGLOBIN A1C
Hgb A1c MFr Bld: 5.3 % of total Hgb (ref <5.7)
Mean Plasma Glucose: 105 mg/dL
eAG (mmol/L): 5.8 mmol/L

## 2022-07-24 LAB — CBC WITH DIFFERENTIAL/PLATELET
Absolute Monocytes: 321 cells/uL (ref 200–950)
Eosinophils Absolute: 189 cells/uL (ref 15–500)
Eosinophils Relative: 3.7 %
HCT: 48.6 % (ref 38.5–50.0)
Hemoglobin: 16.7 g/dL (ref 13.2–17.1)
Lymphs Abs: 1214 cells/uL (ref 850–3900)
MCHC: 34.4 g/dL (ref 32.0–36.0)
Neutro Abs: 3335 cells/uL (ref 1500–7800)
RBC: 4.94 10*6/uL (ref 4.20–5.80)
RDW: 12.3 % (ref 11.0–15.0)
Total Lymphocyte: 23.8 %
WBC: 5.1 10*3/uL (ref 3.8–10.8)

## 2022-07-24 LAB — PSA: PSA: 3.8 ng/mL (ref <=4.00)

## 2022-07-24 LAB — TSH: TSH: 1.73 mIU/L (ref 0.40–4.50)

## 2022-08-02 ENCOUNTER — Encounter: Payer: Self-pay | Admitting: Family Medicine

## 2022-08-02 ENCOUNTER — Telehealth: Payer: Self-pay | Admitting: Family Medicine

## 2022-08-02 NOTE — Telephone Encounter (Signed)
Pt called stating he is returning a call from last week, pt is calling re: labs

## 2022-08-05 NOTE — Telephone Encounter (Signed)
Left message on machine for patient to return our call 

## 2022-08-05 NOTE — Telephone Encounter (Signed)
Reviewed lab results with patient.

## 2022-08-05 NOTE — Telephone Encounter (Signed)
Pt called, returning CMA's call. CMA was with a patient. Pt asked that CMA call back at her earliest convenience. 

## 2022-08-10 NOTE — Telephone Encounter (Signed)
Lab results given to pt.

## 2023-04-05 ENCOUNTER — Other Ambulatory Visit: Payer: Self-pay

## 2023-04-05 ENCOUNTER — Ambulatory Visit: Admitting: Family Medicine

## 2023-04-05 ENCOUNTER — Encounter: Payer: Self-pay | Admitting: Family Medicine

## 2023-04-05 VITALS — BP 160/90 | HR 67 | Ht 65.5 in | Wt 150.0 lb

## 2023-04-05 DIAGNOSIS — M65341 Trigger finger, right ring finger: Secondary | ICD-10-CM | POA: Diagnosis not present

## 2023-04-05 DIAGNOSIS — M79641 Pain in right hand: Secondary | ICD-10-CM

## 2023-04-05 NOTE — Progress Notes (Unsigned)
   I, Stevenson Clinch, CMA acting as a scribe for Clementeen Graham, MD.  Eric Weeks is a 58 y.o. male who presents to [facility] at [facility] today for right hand/finger pain x 2 weeks. Pt locates pain to right hand 2nd and 4th finger pain. Denies n/t. The 4th finger is triggering. Sleep is supportive device that prohibits the fingers from bending. Has tried ice without long term relief.   Treatments tried: ice, bracing  Pertinent review of systems: No fevers or chills  Relevant historical information: Otherwise healthy   Exam:  BP (!) 160/90   Pulse 67   Ht 5' 5.5" (1.664 m)   Wt 150 lb (68 kg)   SpO2 97%   BMI 24.58 kg/m  General: Well Developed, well nourished, and in no acute distress.   MSK: Right hand normal-appearing Triggering felt with flexion of the fourth PIP.  Tender palpation palmar fourth MCP.  Intact strength.    Lab and Radiology Results  Procedure: Real-time Ultrasound Guided Injection of right fourth digit A1 pulley tendon sheath (trigger finger injection) Device: Philips Affiniti 50G/GE Logiq Images permanently stored and available for review in PACS Verbal informed consent obtained.  Discussed risks and benefits of procedure. Warned about infection, bleeding, hyperglycemia damage to structures among others. Patient expresses understanding and agreement Time-out conducted.   Noted no overlying erythema, induration, or other signs of local infection.   Skin prepped in a sterile fashion.   Local anesthesia: Topical Ethyl chloride.   With sterile technique and under real time ultrasound guidance: 40 mg of Kenalog and 1 mL of lidocaine injected into tendon sheath right fourth A1 pulley. Fluid seen entering the tendon sheath.   Completed without difficulty   Pain immediately resolved suggesting accurate placement of the medication.   Advised to call if fevers/chills, erythema, induration, drainage, or persistent bleeding.   Images  permanently stored and available for review in the ultrasound unit.  Impression: Technically successful ultrasound guided injection.         Assessment and Plan: 58 y.o. male with right fourth trigger finger.  Plan for injection today followed by double Band-Aid splint and Voltaren gel.  Check back as needed.   PDMP not reviewed this encounter. Orders Placed This Encounter  Procedures   [place] LIMITED JOINT SPACE STRUCTURES UP RIGHT(NO LINKED CHARGES)    Reason for Exam (SYMPTOM  OR DIAGNOSIS REQUIRED):   right hand pain    Preferred imaging location?:   [facility] [place]   No orders of the defined types were placed in this encounter.    Discussed warning signs or symptoms. Please see discharge instructions. Patient expresses understanding.   The above documentation has been reviewed and is accurate and complete Clementeen Graham, M.D.

## 2023-04-05 NOTE — Patient Instructions (Addendum)
 Thank you for coming in today.   Please use Voltaren gel (Generic Diclofenac Gel) up to 4x daily for pain as needed.  This is available over-the-counter as both the name brand Voltaren gel and the generic diclofenac gel.   Try the double bandaid splint.   Let me know if sx don't improve.

## 2023-07-29 ENCOUNTER — Encounter: Payer: No Typology Code available for payment source | Admitting: Family Medicine

## 2023-08-12 ENCOUNTER — Ambulatory Visit (INDEPENDENT_AMBULATORY_CARE_PROVIDER_SITE_OTHER): Admitting: Family Medicine

## 2023-08-12 ENCOUNTER — Encounter: Payer: Self-pay | Admitting: Family Medicine

## 2023-08-12 VITALS — BP 98/62 | HR 65 | Temp 97.9°F | Ht 66.0 in | Wt 147.6 lb

## 2023-08-12 DIAGNOSIS — Z125 Encounter for screening for malignant neoplasm of prostate: Secondary | ICD-10-CM | POA: Diagnosis not present

## 2023-08-12 DIAGNOSIS — Z131 Encounter for screening for diabetes mellitus: Secondary | ICD-10-CM | POA: Diagnosis not present

## 2023-08-12 DIAGNOSIS — Z0000 Encounter for general adult medical examination without abnormal findings: Secondary | ICD-10-CM | POA: Diagnosis not present

## 2023-08-12 DIAGNOSIS — Z13220 Encounter for screening for lipoid disorders: Secondary | ICD-10-CM | POA: Diagnosis not present

## 2023-08-12 LAB — HEPATIC FUNCTION PANEL
ALT: 11 U/L (ref 0–53)
AST: 20 U/L (ref 0–37)
Albumin: 4.3 g/dL (ref 3.5–5.2)
Alkaline Phosphatase: 43 U/L (ref 39–117)
Bilirubin, Direct: 0.2 mg/dL (ref 0.0–0.3)
Total Bilirubin: 1.2 mg/dL (ref 0.2–1.2)
Total Protein: 6.3 g/dL (ref 6.0–8.3)

## 2023-08-12 LAB — CBC WITH DIFFERENTIAL/PLATELET
Basophils Absolute: 0 K/uL (ref 0.0–0.1)
Basophils Relative: 0.7 % (ref 0.0–3.0)
Eosinophils Absolute: 0.2 K/uL (ref 0.0–0.7)
Eosinophils Relative: 4.7 % (ref 0.0–5.0)
HCT: 44.7 % (ref 39.0–52.0)
Hemoglobin: 15.5 g/dL (ref 13.0–17.0)
Lymphocytes Relative: 26.2 % (ref 12.0–46.0)
Lymphs Abs: 1.1 K/uL (ref 0.7–4.0)
MCHC: 34.7 g/dL (ref 30.0–36.0)
MCV: 97.2 fl (ref 78.0–100.0)
Monocytes Absolute: 0.3 K/uL (ref 0.1–1.0)
Monocytes Relative: 7 % (ref 3.0–12.0)
Neutro Abs: 2.6 K/uL (ref 1.4–7.7)
Neutrophils Relative %: 61.4 % (ref 43.0–77.0)
Platelets: 237 K/uL (ref 150.0–400.0)
RBC: 4.6 Mil/uL (ref 4.22–5.81)
RDW: 12.8 % (ref 11.5–15.5)
WBC: 4.2 K/uL (ref 4.0–10.5)

## 2023-08-12 LAB — HEMOGLOBIN A1C: Hgb A1c MFr Bld: 5.2 % (ref 4.6–6.5)

## 2023-08-12 LAB — LIPID PANEL
Cholesterol: 220 mg/dL — ABNORMAL HIGH (ref 0–200)
HDL: 74.4 mg/dL (ref >39.00)
LDL Cholesterol: 129 mg/dL — ABNORMAL HIGH (ref 0–99)
NonHDL: 145.95
Total CHOL/HDL Ratio: 3
Triglycerides: 87 mg/dL (ref 0.0–149.0)
VLDL: 17.4 mg/dL (ref 0.0–40.0)

## 2023-08-12 LAB — BASIC METABOLIC PANEL WITH GFR
BUN: 15 mg/dL (ref 6–23)
CO2: 29 meq/L (ref 19–32)
Calcium: 9.4 mg/dL (ref 8.4–10.5)
Chloride: 103 meq/L (ref 96–112)
Creatinine, Ser: 1.08 mg/dL (ref 0.40–1.50)
GFR: 74.87 mL/min (ref >60.00)
Glucose, Bld: 90 mg/dL (ref 70–99)
Potassium: 4.3 meq/L (ref 3.5–5.1)
Sodium: 141 meq/L (ref 135–145)

## 2023-08-12 LAB — PSA: PSA: 3.78 ng/mL (ref 0.10–4.00)

## 2023-08-12 LAB — TSH: TSH: 1.77 u[IU]/mL (ref 0.35–5.50)

## 2023-08-12 NOTE — Progress Notes (Signed)
 Subjective:    Patient ID: Eric Weeks, male    DOB: 16-Jan-1965, 58 y.o.   MRN: [id number]  HPI Here for a well exam. He feels fine.   Review of Systems  Constitutional: Negative.   HENT: Negative.    Eyes: Negative.   Respiratory: Negative.    Cardiovascular: Negative.   Gastrointestinal: Negative.   Genitourinary: Negative.   Musculoskeletal: Negative.   Skin: Negative.   Neurological: Negative.   Psychiatric/Behavioral: Negative.         Objective:   Physical Exam Constitutional:      General: He is not in acute distress.    Appearance: Normal appearance. He is well-developed. He is not diaphoretic.  HENT:     Head: Normocephalic and atraumatic.     Right Ear: External ear normal.     Left Ear: External ear normal.     Nose: Nose normal.     Mouth/Throat:     Pharynx: No oropharyngeal exudate.  Eyes:     General: No scleral icterus.       Right eye: No discharge.        Left eye: No discharge.     Conjunctiva/sclera: Conjunctivae normal.     Pupils: Pupils are equal, round, and reactive to light.  Neck:     Thyroid : No thyromegaly.     Vascular: No JVD.     Trachea: No tracheal deviation.  Cardiovascular:     Rate and Rhythm: Normal rate and regular rhythm.     Pulses: Normal pulses.     Heart sounds: Normal heart sounds. No murmur heard.    No friction rub. No gallop.  Pulmonary:     Effort: Pulmonary effort is normal. No respiratory distress.     Breath sounds: Normal breath sounds. No wheezing or rales.  Chest:     Chest wall: No tenderness.  Abdominal:     General: Bowel sounds are normal. There is no distension.     Palpations: Abdomen is soft. There is no mass.     Tenderness: There is no abdominal tenderness. There is no guarding or rebound.  Genitourinary:    Penis: Normal. No tenderness.      Testes: Normal.     Prostate: Normal.     Rectum: Normal. Guaiac result negative.  Musculoskeletal:        General: No tenderness. Normal range  of motion.     Cervical back: Neck supple.  Lymphadenopathy:     Cervical: No cervical adenopathy.  Skin:    General: Skin is warm and dry.     Coloration: Skin is not pale.     Findings: No erythema or rash.  Neurological:     General: No focal deficit present.     Mental Status: He is alert and oriented to person, place, and time.     Cranial Nerves: No cranial nerve deficit.     Motor: No abnormal muscle tone.     Coordination: Coordination normal.     Deep Tendon Reflexes: Reflexes are normal and symmetric. Reflexes normal.  Psychiatric:        Mood and Affect: Mood normal.        Behavior: Behavior normal.        Thought Content: Thought content normal.        Judgment: Judgment normal.           Assessment & Plan:  Well exam. We discussed diet and exercise. Get fasting labs. Garnette Olmsted, MD

## 2023-08-15 ENCOUNTER — Ambulatory Visit: Payer: Self-pay | Admitting: Family Medicine

## 2023-09-23 ENCOUNTER — Ambulatory Visit: Admitting: Family Medicine

## 2023-10-03 ENCOUNTER — Ambulatory Visit: Admitting: Family Medicine

## 2023-10-10 ENCOUNTER — Ambulatory Visit: Admitting: Family Medicine

## 2023-11-17 ENCOUNTER — Encounter: Payer: Self-pay | Admitting: Family Medicine

## 2023-11-17 ENCOUNTER — Ambulatory Visit: Admitting: Family Medicine

## 2023-11-17 ENCOUNTER — Other Ambulatory Visit: Payer: Self-pay

## 2023-11-17 VITALS — BP 138/88 | HR 65 | Ht 66.0 in | Wt 147.0 lb

## 2023-11-17 DIAGNOSIS — M65321 Trigger finger, right index finger: Secondary | ICD-10-CM | POA: Diagnosis not present

## 2023-11-17 NOTE — Progress Notes (Unsigned)
   I, Eric Weeks, CMA acting as a scribe for Eric Lloyd, MD.  Eric Weeks is a 58 y.o. male who presents to [facility] at [facility] today for re-occurring trigger finger. Pt was last seen by Dr. Lloyd on 04/05/23 and was given a R 4th trigger finger injection.  Today, pt reports 7+ months, notes that the 2nd digit on the right was triggering a the same time as the 4th digit on the right. Denies swelling. Sx worse at night, stiffness. Improves with warm  water. Denies nodules on the palm. Using taping at nighttime.   Pertinent review of systems: No fevers or chills  Relevant historical information: History of trigger finger fourth digit right hand.   Exam:  BP 138/88   Pulse 65   Ht 5' 6 (1.676 m)   Wt 147 lb (66.7 kg)   SpO2 100%   BMI 23.73 kg/m  General: Well Developed, well nourished, and in no acute distress.   MSK: Right hand normal appearing.  Tender palpation palmar second MCP area.  Minimal triggering is present with flexion.    Lab and Radiology Results  Procedure: Real-time Ultrasound Guided Injection of right second MCP tendon sheath at A1 pulley.  Trigger finger injection Device: [employer] Affiniti 50G/GE Logiq Images permanently stored and available for review in PACS Verbal informed consent obtained.  Discussed risks and benefits of procedure. Warned about infection, bleeding, hyperglycemia damage to structures among others. Patient expresses understanding and agreement Time-out conducted.   Noted no overlying erythema, induration, or other signs of local infection.   Skin prepped in a sterile fashion.   Local anesthesia: Topical Ethyl chloride.   With sterile technique and under real time ultrasound guidance: 40 mg of Kenalog and 1 mL of lidocaine injected into A1 pulley tendon sheath. Fluid seen entering the tendon sheath.   Completed without difficulty   Pain immediately resolved suggesting accurate placement of the medication.    Advised to call if fevers/chills, erythema, induration, drainage, or persistent bleeding.   Images permanently stored and available for review in the ultrasound unit.  Impression: Technically successful ultrasound guided injection.      Assessment and Plan: 58 y.o. male with right hand trigger finger and index finger.  Plan for injection today.  Check back as needed. Recommend double Band-Aid splint  PDMP not reviewed this encounter. Orders Placed This Encounter  Procedures   [place]  LIMITED JOINT SPACE STRUCTURES UP RIGHT(NO LINKED CHARGES)    Reason for Exam (SYMPTOM  OR DIAGNOSIS REQUIRED):   right trigger finger    Preferred imaging location?:   [facility] [place]   No orders of the defined types were placed in this encounter.    Discussed warning signs or symptoms. Please see discharge instructions. Patient expresses understanding.   The above documentation has been reviewed and is accurate and complete Eric Weeks, M.D.

## 2023-11-17 NOTE — Patient Instructions (Addendum)
 Thank you for coming in today.   You received an injection today. Seek immediate medical attention if the joint becomes red, extremely painful, or is oozing fluid.   See you back as needed.
